# Patient Record
Sex: Female | Born: 1935
Health system: Southern US, Community
[De-identification: ages and names within clinical notes are randomized; demographics above are authoritative.]

## PROBLEM LIST (undated history)

## (undated) DIAGNOSIS — E785 Hyperlipidemia, unspecified: Secondary | ICD-10-CM

## (undated) DIAGNOSIS — I442 Atrioventricular block, complete: Secondary | ICD-10-CM

## (undated) DIAGNOSIS — R600 Localized edema: Secondary | ICD-10-CM

## (undated) DIAGNOSIS — R011 Cardiac murmur, unspecified: Secondary | ICD-10-CM

## (undated) DIAGNOSIS — E039 Hypothyroidism, unspecified: Secondary | ICD-10-CM

## (undated) DIAGNOSIS — I509 Heart failure, unspecified: Secondary | ICD-10-CM

## (undated) DIAGNOSIS — I10 Essential (primary) hypertension: Secondary | ICD-10-CM

## (undated) DIAGNOSIS — N318 Other neuromuscular dysfunction of bladder: Secondary | ICD-10-CM

## (undated) DIAGNOSIS — F419 Anxiety disorder, unspecified: Secondary | ICD-10-CM

## (undated) DIAGNOSIS — R55 Syncope and collapse: Secondary | ICD-10-CM

## (undated) DIAGNOSIS — Z950 Presence of cardiac pacemaker: Secondary | ICD-10-CM

## (undated) DIAGNOSIS — I495 Sick sinus syndrome: Secondary | ICD-10-CM

## (undated) DIAGNOSIS — M810 Age-related osteoporosis without current pathological fracture: Secondary | ICD-10-CM

## (undated) HISTORY — DX: Anxiety disorder, unspecified: F41.9

## (undated) HISTORY — DX: Hyperlipidemia, unspecified: E78.5

## (undated) HISTORY — DX: Syncope and collapse: R55

## (undated) HISTORY — DX: Atrioventricular block, complete: I44.2

## (undated) HISTORY — DX: Other neuromuscular dysfunction of bladder: N31.8

## (undated) HISTORY — DX: Age-related osteoporosis without current pathological fracture: M81.0

## (undated) HISTORY — DX: Presence of cardiac pacemaker: Z95.0

## (undated) HISTORY — DX: Sick sinus syndrome: I49.5

## (undated) HISTORY — PX: PACEMAKER PLACEMENT: SHX43

## (undated) HISTORY — DX: Essential (primary) hypertension: I10

## (undated) HISTORY — DX: Cardiac murmur, unspecified: R01.1

## (undated) HISTORY — DX: Localized edema: R60.0

## (undated) HISTORY — DX: Heart failure, unspecified: I50.9

## (undated) HISTORY — DX: Hypothyroidism, unspecified: E03.9

---

## 1986-11-25 HISTORY — PX: CARDIAC CATHETERIZATION: SHX172

## 1998-05-17 ENCOUNTER — Inpatient Hospital Stay (HOSPITAL_COMMUNITY): Admission: EM | Admit: 1998-05-17 | Discharge: 1998-05-20 | Payer: Self-pay | Admitting: Emergency Medicine

## 1998-05-18 ENCOUNTER — Encounter: Payer: Self-pay | Admitting: Cardiovascular Disease

## 1998-05-18 DIAGNOSIS — I442 Atrioventricular block, complete: Secondary | ICD-10-CM

## 1998-05-18 DIAGNOSIS — Z950 Presence of cardiac pacemaker: Secondary | ICD-10-CM

## 1998-05-18 HISTORY — DX: Presence of cardiac pacemaker: Z95.0

## 1998-05-18 HISTORY — PX: PACEMAKER PLACEMENT: SHX43

## 1998-05-18 HISTORY — DX: Atrioventricular block, complete: I44.2

## 1998-05-19 ENCOUNTER — Encounter: Payer: Self-pay | Admitting: Cardiovascular Disease

## 1999-01-10 ENCOUNTER — Encounter: Payer: Self-pay | Admitting: Surgery

## 1999-01-10 ENCOUNTER — Encounter: Admission: RE | Admit: 1999-01-10 | Discharge: 1999-01-10 | Payer: Self-pay | Admitting: Surgery

## 2000-03-06 ENCOUNTER — Encounter: Payer: Self-pay | Admitting: Surgery

## 2000-03-06 ENCOUNTER — Encounter: Admission: RE | Admit: 2000-03-06 | Discharge: 2000-03-06 | Payer: Self-pay | Admitting: Surgery

## 2003-05-13 ENCOUNTER — Observation Stay (HOSPITAL_COMMUNITY): Admission: EM | Admit: 2003-05-13 | Discharge: 2003-05-13 | Payer: Self-pay | Admitting: Emergency Medicine

## 2005-02-14 ENCOUNTER — Encounter: Admission: RE | Admit: 2005-02-14 | Discharge: 2005-02-14 | Payer: Self-pay | Admitting: Internal Medicine

## 2005-03-06 ENCOUNTER — Encounter: Admission: RE | Admit: 2005-03-06 | Discharge: 2005-03-06 | Payer: Self-pay | Admitting: Internal Medicine

## 2006-02-13 ENCOUNTER — Ambulatory Visit (HOSPITAL_COMMUNITY): Admission: RE | Admit: 2006-02-13 | Discharge: 2006-02-14 | Payer: Self-pay | Admitting: *Deleted

## 2006-02-13 HISTORY — PX: PACEMAKER PLACEMENT: SHX43

## 2007-11-28 IMAGING — CR DG CHEST 1V PORT
1 series · 1 of 1 positions shown · non-contrast
Comparison: none

CLINICAL DATA: 85-year-old end-of-life battery pacemaker placement. Shortness of breath.
 PORTABLE CHEST - 1 VIEW:

[view not recorded]
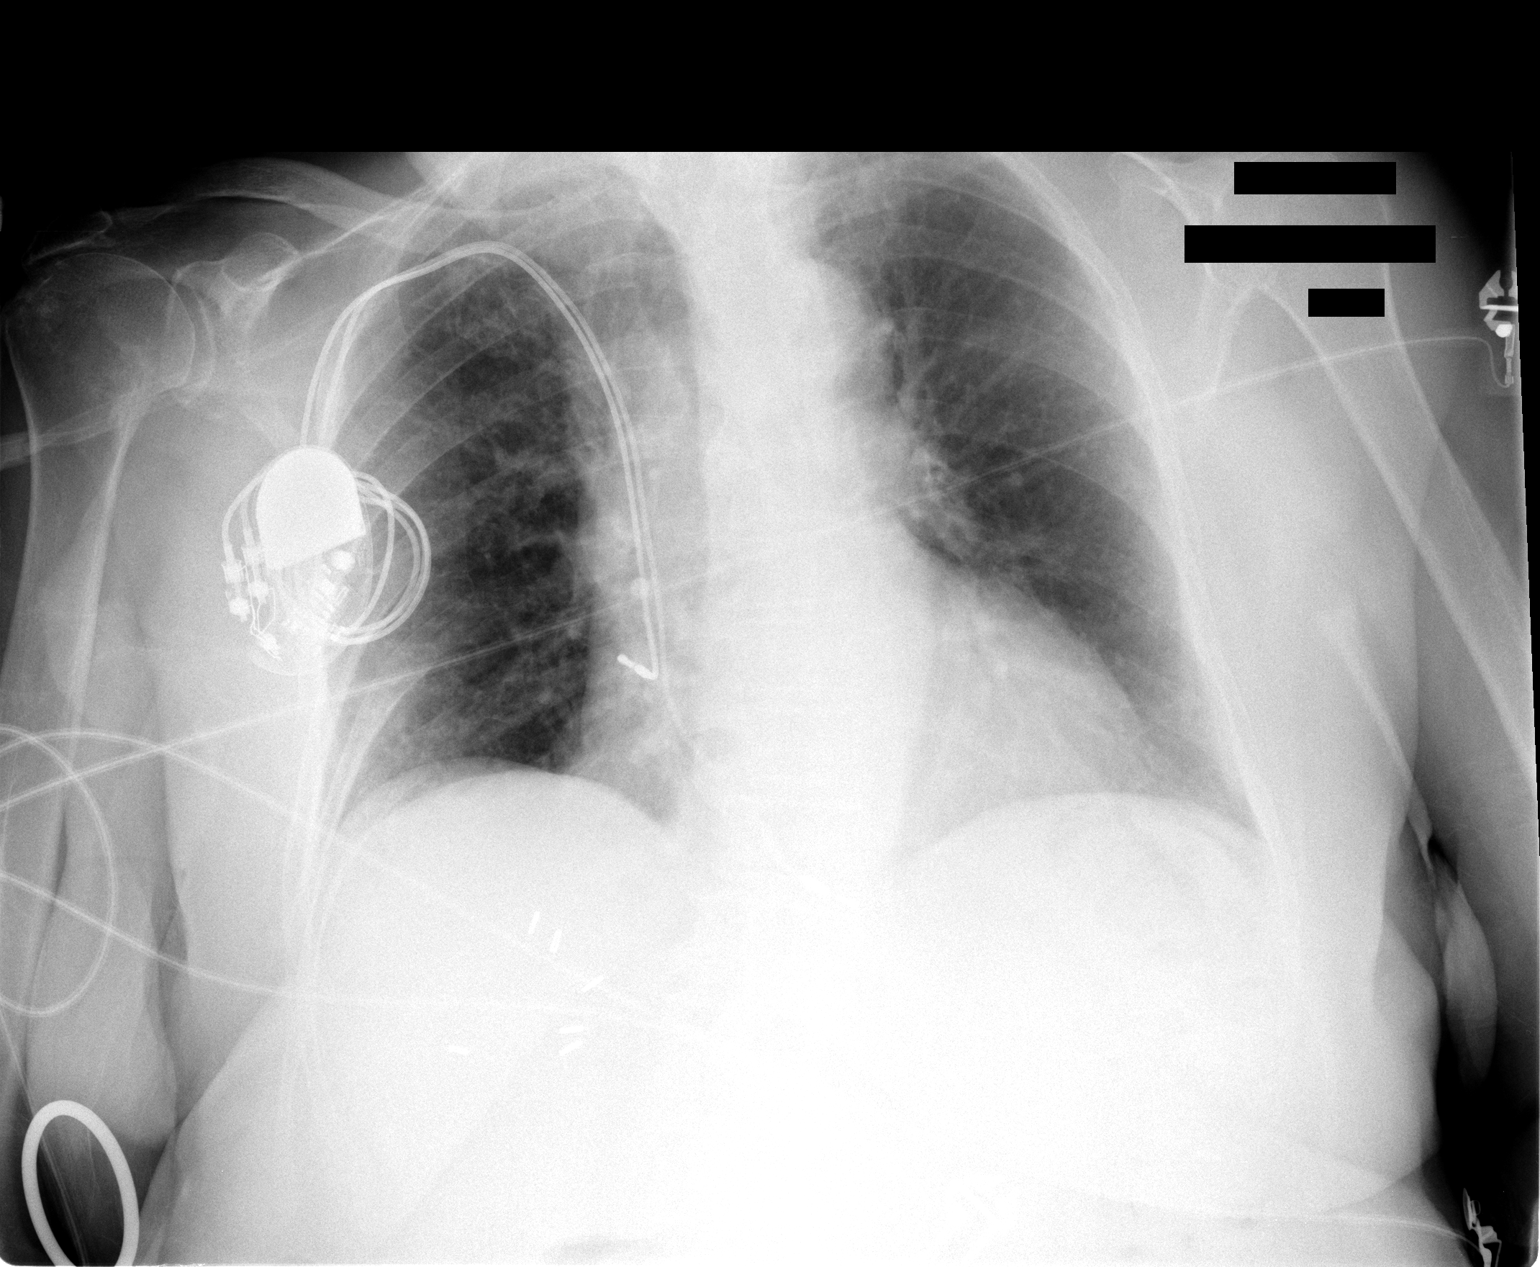

[1 of 1 positions shown; findings below may reference images not displayed]

FINDINGS: Pacer wires are in good position and unchanged.  Chronic lung changes but no acute pulmonary findings.  No pneumothorax is seen.
IMPRESSION: 1.  Pacer wires in good position without complicating features. 
 2.  Chronic lung changes, but no acute pulmonary findings.

## 2007-11-28 IMAGING — CR DG CHEST 2V
2 series · 2 of 2 positions shown · non-contrast
Comparison: 05/13/03.

CLINICAL DATA: Pacemaker reinsertion.  
 CHEST ? 2 VIEW ? 02/13/06 AT [DATE] A.M.:

[view not recorded (1 of 2)]
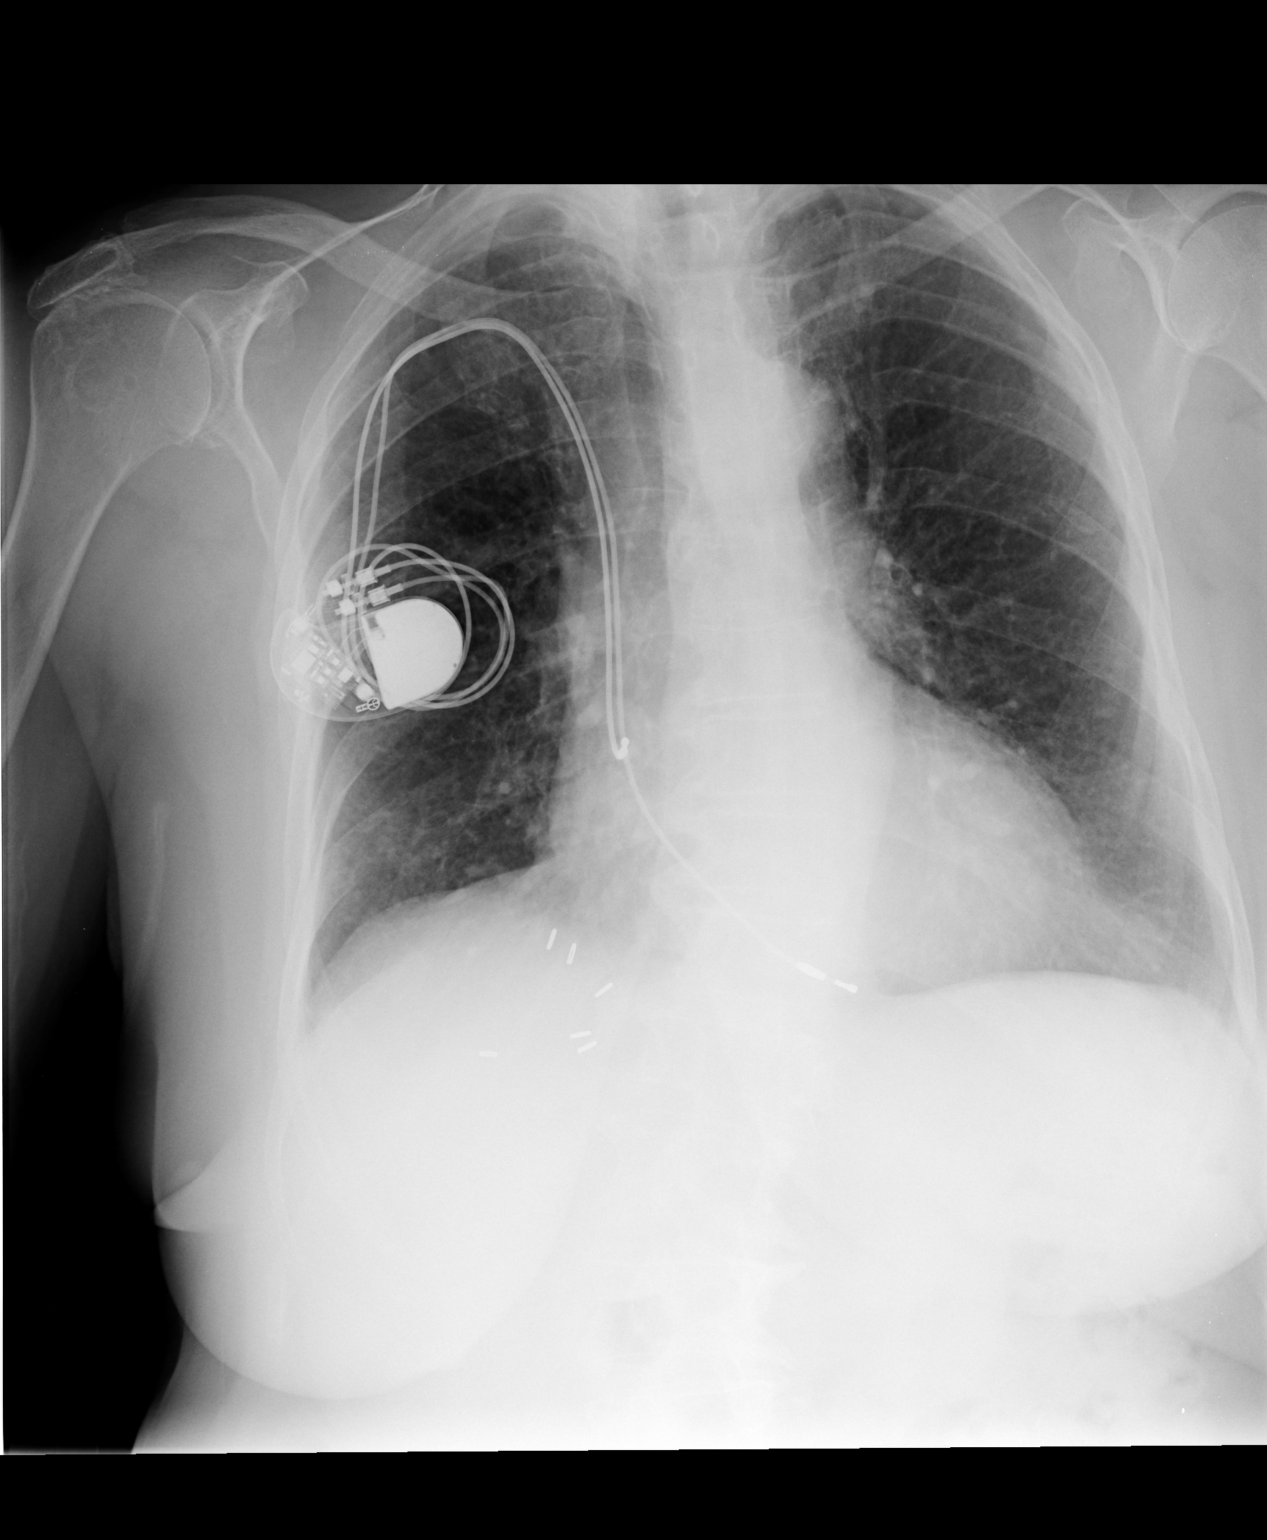

[view not recorded (2 of 2)]
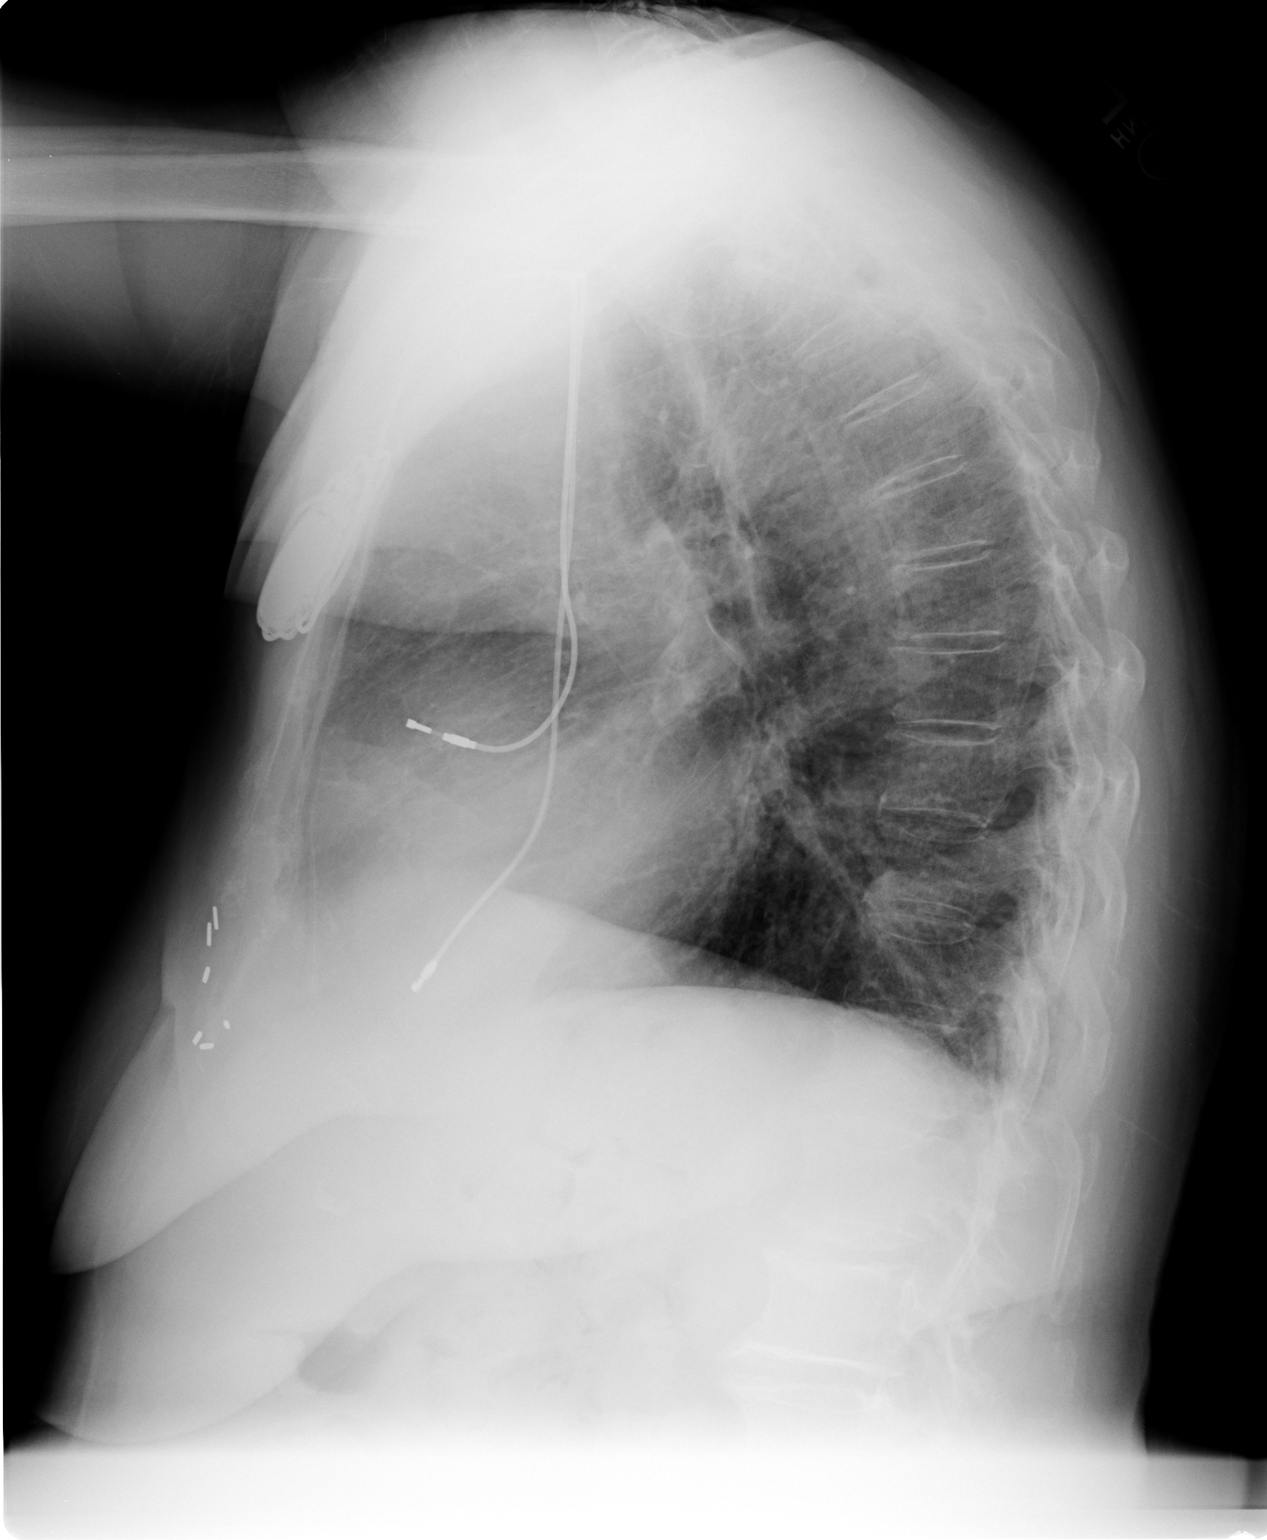

[2 of 2 positions shown; findings below may reference images not displayed]

FINDINGS: A permanent sequential pacemaker is in place entering from the right with the leads in the region of the right atrium and right ventricle.  The heart is slightly prominent in size.  No infiltrate or congestive heart failure.  Chronic increased lung markings.
IMPRESSION: 1.  Permanent sequential pacemaker is placed with mild cardiomegaly.
 2.  No infiltrate or congestive heart failure.

## 2007-11-29 IMAGING — CR DG CHEST 2V
2 series · 2 of 2 positions shown · non-contrast
Comparison: 02/13/06.

CLINICAL DATA: Preop for pacer. 
 CHEST ? 2 VIEW:

[w chest pa]
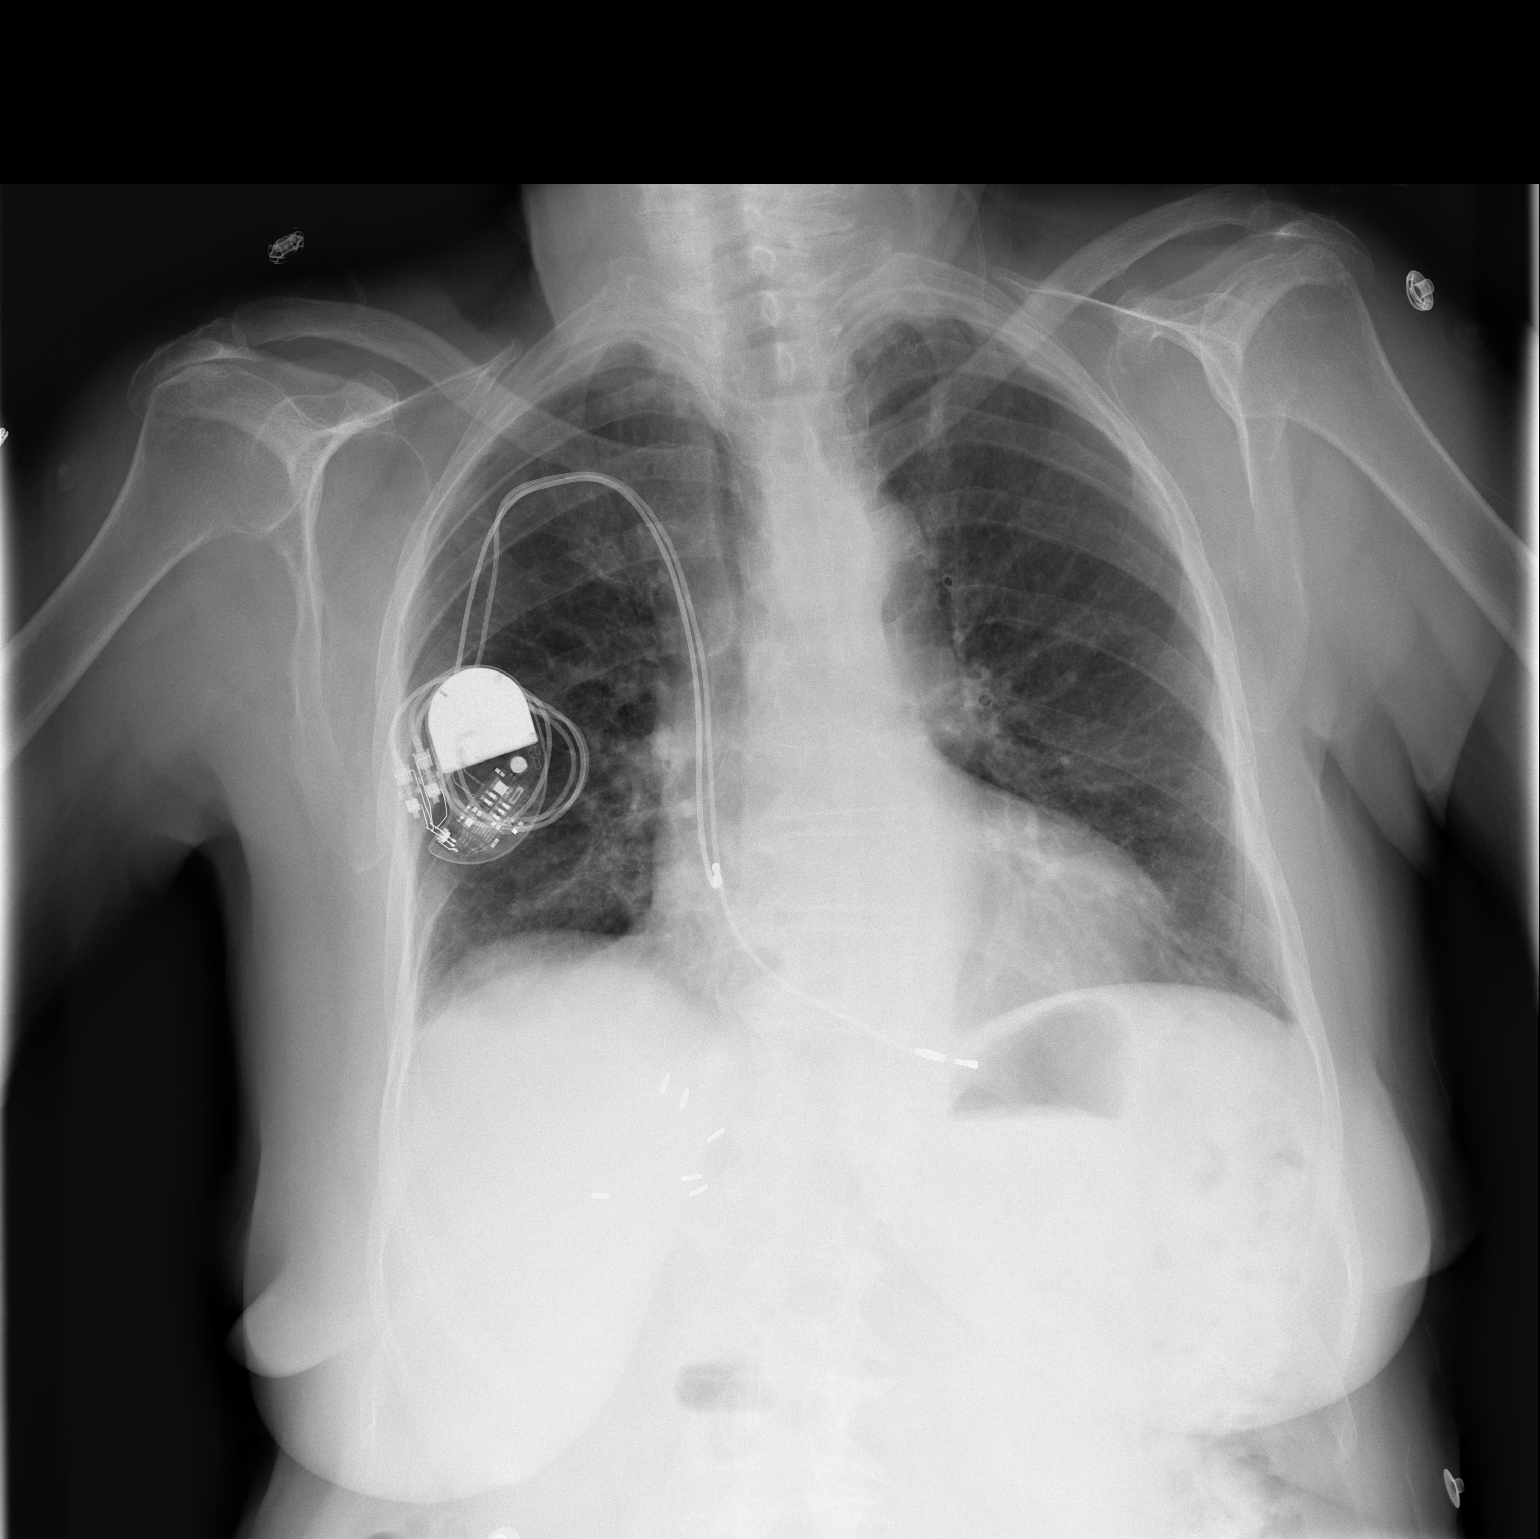

[w chest lat]
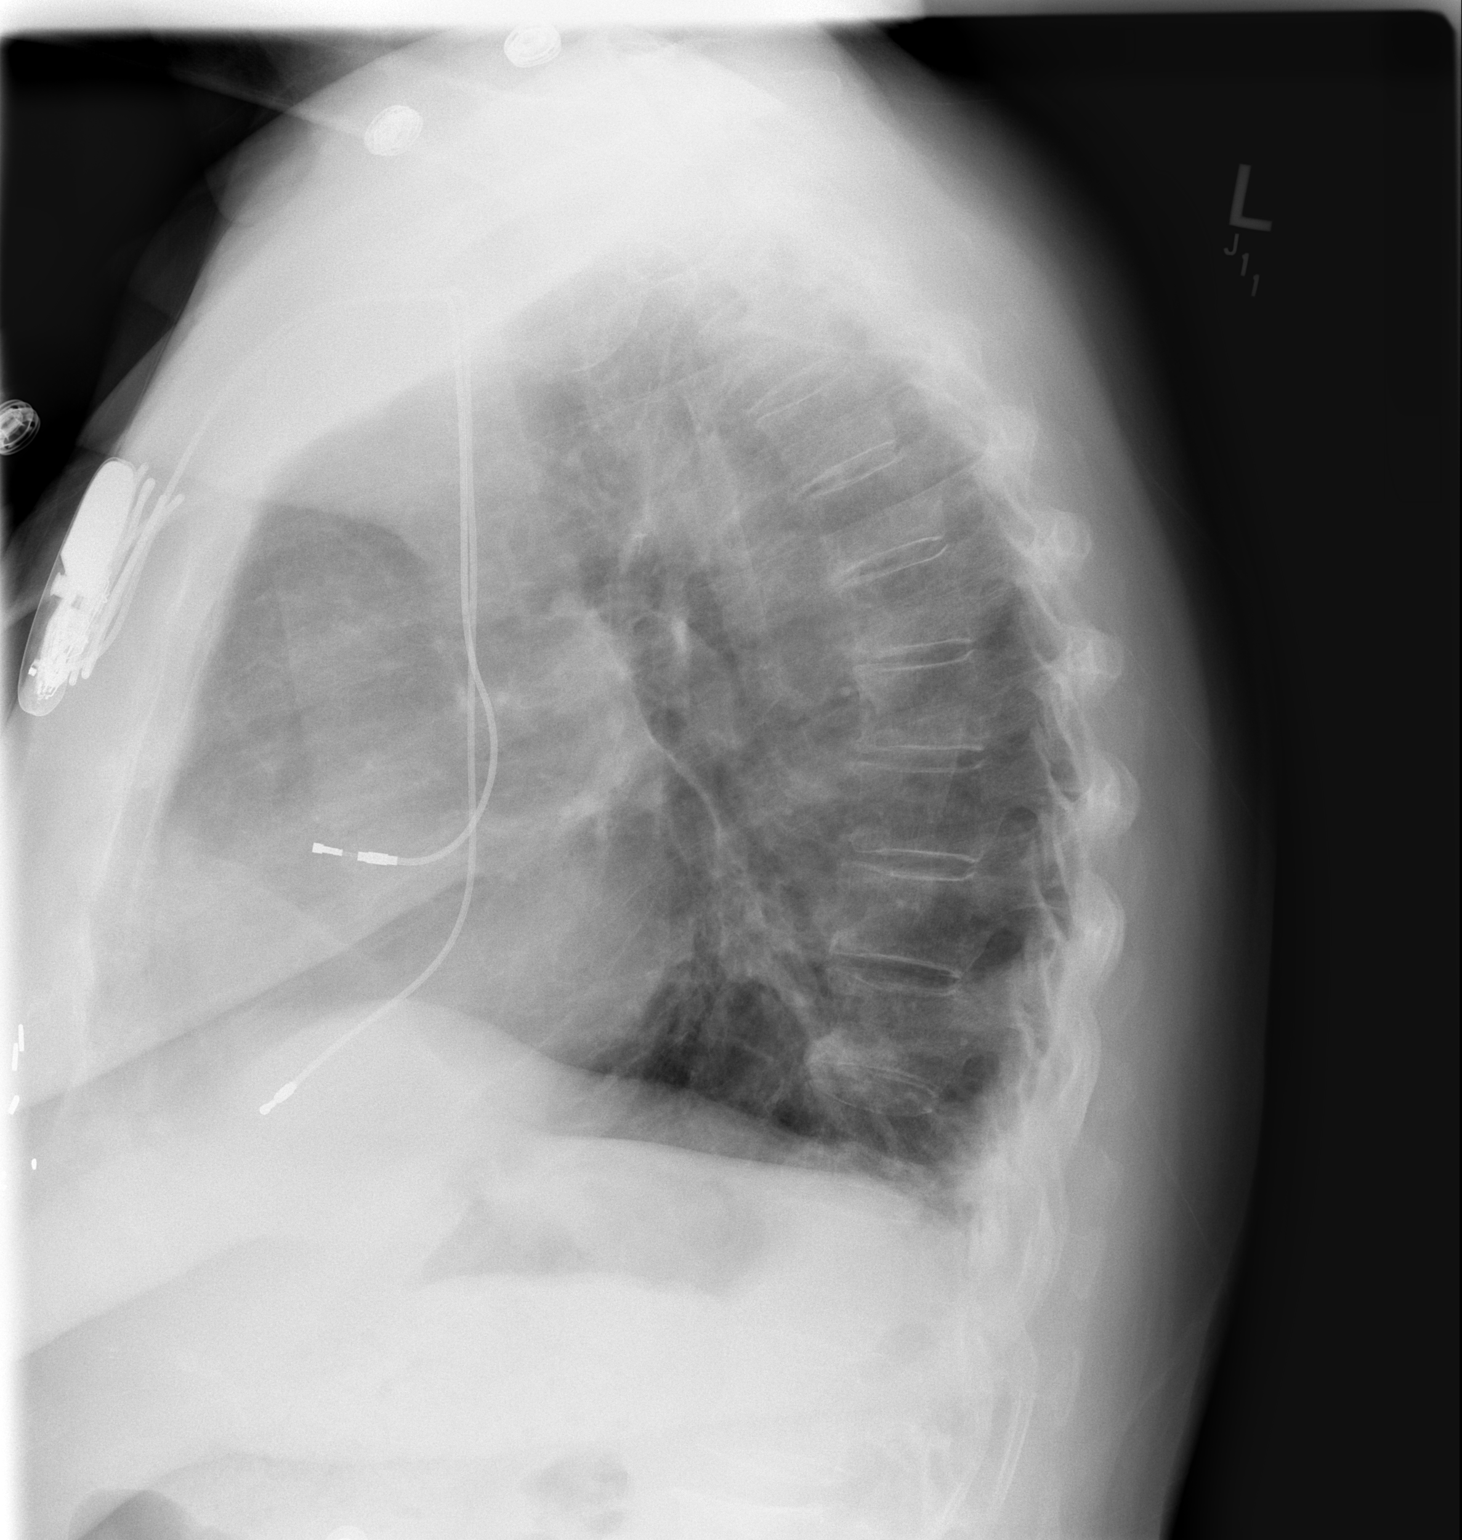

[2 of 2 positions shown; findings below may reference images not displayed]

FINDINGS: The lungs are clear and slightly hyperaerated.  Cardiomegaly is stable.  Permanent pacer remains.
IMPRESSION: No change in cardiomegaly with pacer.  No active lung disease.

## 2010-04-09 ENCOUNTER — Encounter: Payer: Self-pay | Admitting: Internal Medicine

## 2012-04-30 ENCOUNTER — Encounter: Payer: Self-pay | Admitting: *Deleted

## 2012-08-05 ENCOUNTER — Other Ambulatory Visit: Payer: Self-pay | Admitting: Cardiovascular Disease

## 2012-08-05 LAB — COMPREHENSIVE METABOLIC PANEL
Albumin: 4.2 g/dL (ref 3.5–5.2)
CO2: 29 mEq/L (ref 19–32)
Calcium: 9.7 mg/dL (ref 8.4–10.5)
Glucose, Bld: 90 mg/dL (ref 70–99)
Potassium: 4.4 mEq/L (ref 3.5–5.3)
Sodium: 141 mEq/L (ref 135–145)
Total Bilirubin: 0.9 mg/dL (ref 0.3–1.2)
Total Protein: 7 g/dL (ref 6.0–8.3)

## 2012-08-05 LAB — CBC WITH DIFFERENTIAL/PLATELET
Eosinophils Absolute: 0.3 10*3/uL (ref 0.0–0.7)
Hemoglobin: 13.1 g/dL (ref 12.0–15.0)
Lymphs Abs: 2.8 10*3/uL (ref 0.7–4.0)
MCH: 29.1 pg (ref 26.0–34.0)
Monocytes Relative: 6 % (ref 3–12)
Neutrophils Relative %: 44 % (ref 43–77)
RBC: 4.5 MIL/uL (ref 3.87–5.11)

## 2012-08-05 LAB — LIPID PANEL
Cholesterol: 157 mg/dL (ref 0–200)
Triglycerides: 155 mg/dL — ABNORMAL HIGH (ref <150)

## 2012-08-07 ENCOUNTER — Encounter: Payer: Self-pay | Admitting: Cardiovascular Disease

## 2012-08-21 ENCOUNTER — Ambulatory Visit (HOSPITAL_COMMUNITY): Payer: Self-pay

## 2012-12-30 ENCOUNTER — Telehealth: Payer: Self-pay | Admitting: Cardiovascular Disease

## 2013-01-01 MED ORDER — LOSARTAN POTASSIUM 50 MG PO TABS: 50.0000 mg | ORAL_TABLET | Freq: Every day | ORAL | Status: DC

## 2013-01-01 MED ORDER — SIMVASTATIN 40 MG PO TABS: 40.0000 mg | ORAL_TABLET | Freq: Every evening | ORAL | Status: DC

## 2013-01-01 MED ORDER — LEVOTHYROXINE SODIUM 50 MCG PO TABS: 50.0000 ug | ORAL_TABLET | Freq: Every day | ORAL | Status: DC

## 2013-01-01 MED ORDER — VERAPAMIL HCL 240 MG (CO) PO TB24: 240.0000 mg | ORAL_TABLET | Freq: Every day | ORAL | Status: DC

## 2013-01-01 NOTE — Telephone Encounter (Signed)
Still have not gotten her medicine.The pharmacist want you to call them-[facility]-[phone].Please call today.

## 2013-01-01 NOTE — Telephone Encounter (Signed)
Returned call to [facility].  Informed pt need refills on levothyroxine, verapamil, losartan and simvastatin.  Informed will be reviewed and sent electronically w/ new cardiologist as Dr. Alanda Amass has retired.  Verbalized understanding.  Refill(s) sent to pharmacy.  Levothyroxine sent for one 90-day supply w/o refills and instructions to see PCP for future refills.

## 2013-01-22 ENCOUNTER — Encounter: Payer: Self-pay | Admitting: Cardiovascular Disease

## 2013-01-22 ENCOUNTER — Ambulatory Visit (INDEPENDENT_AMBULATORY_CARE_PROVIDER_SITE_OTHER): Payer: Medicare HMO | Admitting: Cardiovascular Disease

## 2013-01-22 VITALS — BP 138/72 | HR 64 | Resp 16 | Ht 62.0 in | Wt 145.8 lb

## 2013-01-22 DIAGNOSIS — I10 Essential (primary) hypertension: Secondary | ICD-10-CM | POA: Insufficient documentation

## 2013-01-22 DIAGNOSIS — IMO0001 Reserved for inherently not codable concepts without codable children: Secondary | ICD-10-CM | POA: Insufficient documentation

## 2013-01-22 DIAGNOSIS — Z23 Encounter for immunization: Secondary | ICD-10-CM

## 2013-01-22 DIAGNOSIS — Z950 Presence of cardiac pacemaker: Secondary | ICD-10-CM

## 2013-01-22 DIAGNOSIS — Z0389 Encounter for observation for other suspected diseases and conditions ruled out: Secondary | ICD-10-CM

## 2013-01-22 DIAGNOSIS — I442 Atrioventricular block, complete: Secondary | ICD-10-CM | POA: Insufficient documentation

## 2013-01-22 DIAGNOSIS — E785 Hyperlipidemia, unspecified: Secondary | ICD-10-CM

## 2013-01-22 HISTORY — DX: Atrioventricular block, complete: I44.2

## 2013-01-22 LAB — PACEMAKER DEVICE OBSERVATION
ATRIAL PACING PM: 11
DEVICE MODEL PM: 1810064

## 2013-01-22 MED ORDER — PRAVASTATIN SODIUM 40 MG PO TABS: 40.0000 mg | ORAL_TABLET | Freq: Every evening | ORAL | Status: DC

## 2013-01-22 NOTE — Progress Notes (Signed)
Your physician recommends that you schedule a follow-up appointment in: 6 months with Dr.Croitoru    

## 2013-01-22 NOTE — Progress Notes (Signed)
Patient ID: Abigail Melendez, female   DOB: 02-03-1921, 79 y.o.   MRN: [id number]      Reason for office visit Complete heart block status post pacemaker; hypertension; hyperlipidemia  Abigail Melendez is a former patient of Dr. Alanda Amass and is here to establish new cardiology followup. Chest long-standing history of conduction system disease and now has complete heart block and is pacemaker dependent. Her pacemaker was initially implanted in 1991 and we are still using the same leads. Chest subsequent generator changes in 2000 and 2007. Her device is a Engineer, water. Jude victory XL DR device. Comprehensive in office check today shows normal device function. She does not have any underlying escape rhythm and is 100% ventricular paced  She has well treated systemic hypertension hyperlipidemia. She has been on combination of verapamil and simvastatin since January of 2012, so far without any overt evidence of complications secondary to drug interaction. She does complain of "knots" which pop up in her calf muscles. I think she is describing muscle cramps. She does take furosemide without a potassium supplement. She doesn't that she had lab work performed with Dr. Wylene Simmer last month, with normal results.  She has no complaints other than the muscle cramps.   Allergies  Allergen Reactions  . Codeine     Current Outpatient Prescriptions  Medication Sig Dispense Refill  . aspirin 81 MG tablet Take 81 mg by mouth daily.      . furosemide (LASIX) 40 MG tablet Take 40 mg by mouth as needed.       Marland Kitchen levothyroxine (SYNTHROID, LEVOTHROID) 50 MCG tablet Take 1 tablet (50 mcg total) by mouth daily. See PCP for future refills.  90 tablet  0  . losartan (COZAAR) 50 MG tablet Take 1 tablet (50 mg total) by mouth daily.  90 tablet  1  . Multiple Vitamin (MULTIVITAMIN) tablet Take 1 tablet by mouth daily.      . simvastatin (ZOCOR) 40 MG tablet Take 1 tablet (40 mg total) by mouth every evening.  90 tablet  1  . verapamil  (COVERA HS) 240 MG (CO) 24 hr tablet Take 1 tablet (240 mg total) by mouth at bedtime.  90 tablet  1   No current facility-administered medications for this visit.    Past Medical History  Diagnosis Date  . CHF (congestive heart failure)   . Hypertension   . Hyperlipidemia   . Hypothyroidism   . Anxiety   . Complete heart block May 18, 1998    pacemaker implanted  . Pacemaker february 29,2000    St.Jude affinityDR modle#5330 serial#[id number]  . Hypertonicity of bladder   . Osteoporosis   . CHF (congestive heart failure) 08-17-2009 adult echocardiogram    EF 55% mild abnormalities no change in therapy  . Murmur 09-05-2011 adult echocardiogram    EF 55% mild abnormalities; no change in therapy   . Sick sinus syndrome 06-12-2005 persantine cardiolite    normal cardiolite myocardial perfusion study    . Syncope, near 05-13-2003 corotid doppler    mild heterogeneous plaque noted at origins of ica andeca no evidence of significant ica stenosis . vertebral artery flow is antegrade  . Edema leg 10-12-1997 lower extremity venous study    pain and edema x 1 week . no dvt,large non-vascular mass located at the level of the lt. medial knee and extends to the proximal calf  . CHB (complete heart block) 01/22/2013    Pacemaker dependent     Past Surgical History  Procedure  Laterality Date  . Pacemaker placement  inital implant 12-10-1989    symtomatic heart block ; non rate responsive pacemaker paragon II generator  . Pacemaker placement  05-18-1998    new DDDR bipolar autocapture,mode switching pacesetter affinity DR pulse generator model number 5330r; serial # [id number]  . Pacemaker placement  02-13-2006    st. Tomi Likens DR, model [phone], serial [id number]  . Cardiac catheterization  11-25-1986    lt. heart cath for exertional cp and syncope;coronaries normal except very minor 20% lesion in lad possible coronary spasm    No family history on file.  History   Social History  . Marital  Status: Widowed    Spouse Name: N/A    Number of Children: N/A  . Years of Education: N/A   Occupational History  . Not on file.   Social History Main Topics  . Smoking status: Former Games developer  . Smokeless tobacco: Never Used  . Alcohol Use: Not on file  . Drug Use: Not on file  . Sexual Activity: Not on file   Other Topics Concern  . Not on file   Social History Narrative  . No narrative on file    Review of systems: The patient specifically denies any chest pain at rest or with exertion, dyspnea at rest or with exertion, orthopnea, paroxysmal nocturnal dyspnea, syncope, palpitations, focal neurological deficits, intermittent claudication, lower extremity edema, unexplained weight gain, cough, hemoptysis or wheezing.  The patient also denies abdominal pain, nausea, vomiting, dysphagia, diarrhea, constipation, polyuria, polydipsia, dysuria, hematuria, frequency, urgency, abnormal bleeding or bruising, fever, chills, unexpected weight changes, mood swings, change in skin or hair texture, change in voice quality, auditory or visual problems, allergic reactions or rashes, new musculoskeletal complaints other than usual "aches and pains".   PHYSICAL EXAM BP 138/72  Pulse 64  Resp 16  Ht 5\' 2"  (1.575 m)  Wt 145 lb 12.8 oz (66.134 kg)  BMI 26.66 kg/m2  General: Alert, oriented x3, no distress Head: no evidence of trauma, PERRL, EOMI, no exophtalmos or lid lag, no myxedema, no xanthelasma; normal ears, nose and oropharynx Neck: normal jugular venous pulsations and no hepatojugular reflux; brisk carotid pulses without delay and no carotid bruits Chest: clear to auscultation, no signs of consolidation by percussion or palpation, normal fremitus, symmetrical and full respiratory excursions; 3 scars of pacemaker generator procedures in the right subclavian area, the site appears healthy Cardiovascular: normal position and quality of the apical impulse, regular rhythm, normal first and  third occipital he split second heart sounds, no  rubs or gallops. She has a very faint early peaking systolic ejection murmur in the aortic focus radiating towards the carotids but not really reaching them. I do not appreciate any diastolic murmur of aortic insufficiency Abdomen: no tenderness or distention, no masses by palpation, no abnormal pulsatility or arterial bruits, normal bowel sounds, no hepatosplenomegaly Extremities: no clubbing, cyanosis or edema; 2+ radial, ulnar and brachial pulses bilaterally; 2+ right femoral, posterior tibial and dorsalis pedis pulses; 2+ left femoral, posterior tibial and dorsalis pedis pulses; no subclavian or femoral bruits Neurological: grossly nonfocal   EKG: Atrial paced ventricular sensed  Lipid Panel     Component Value Date/Time   CHOL 157 08/05/2012 1058   TRIG 155* 08/05/2012 1058   HDL 48 08/05/2012 1058   CHOLHDL 3.3 08/05/2012 1058   VLDL 31 08/05/2012 1058   LDLCALC 78 08/05/2012 1058    BMET    Component Value Date/Time   NA 141  08/05/2012 1058   K 4.4 08/05/2012 1058   CL 103 08/05/2012 1058   CO2 29 08/05/2012 1058   GLUCOSE 90 08/05/2012 1058   BUN 20 08/05/2012 1058   CREATININE 0.90 08/05/2012 1058   CALCIUM 9.7 08/05/2012 1058     ASSESSMENT AND PLAN  She has no cardiac complaints. Pacemaker settings are all appropriate and no permanent changes were made. She appears to have another 5 years or so of battery life. She is pacemaker dependent and needs to take appropriate precautions to avoid strong magnetic fields. We'll continue checking her device every 6 months.  I am concerned about the combination of verapamil and simvastatin. I have advised that we switch to pravastatin to avoid drug interaction.  At her request we gave her a flu shot today.  Dr. Kandis Cocking notes described hypertrophic cardiomyopathy like physiology with LV outflow tract obstruction, but this is not at all apparent on her physical exam today. She had mild  aortic insufficiency by previous echo. At this time she does not appear to have any hemodynamically important valvular issues.  Meds ordered this encounter  Medications  . Multiple Vitamin (MULTIVITAMIN) tablet    Sig: Take 1 tablet by mouth daily.    Junious Silk, MD, [facility] [phone] office [phone] pager

## 2013-01-22 NOTE — Patient Instructions (Signed)
Your physician has recommended you make the following change in your medication: Stop taking simvastatin; start pravastatin 40 mg in the evening daily Your physician recommends that you schedule a follow-up appointment in: 6 months; we will also check her pacemaker at that time.

## 2013-01-27 ENCOUNTER — Telehealth: Payer: Self-pay | Admitting: *Deleted

## 2013-01-27 NOTE — Telephone Encounter (Signed)
[facility] notified to D/C simvastatin, start Pravastatin 40mg  [facility] sent via fax.

## 2013-01-31 ENCOUNTER — Telehealth: Payer: Self-pay | Admitting: Internal Medicine

## 2013-01-31 NOTE — Telephone Encounter (Signed)
Pt was seen by DR. C 01-22-13/mt

## 2013-07-16 ENCOUNTER — Other Ambulatory Visit: Payer: Self-pay | Admitting: Cardiovascular Disease

## 2013-07-16 NOTE — Telephone Encounter (Signed)
Rx was sent to pharmacy electronically. 

## 2013-07-21 ENCOUNTER — Telehealth: Payer: Self-pay | Admitting: Cardiovascular Disease

## 2013-07-21 MED ORDER — VERAPAMIL HCL 240 MG (CO) PO TB24: 240.0000 mg | ORAL_TABLET | Freq: Every day | ORAL | Status: DC

## 2013-07-21 NOTE — Telephone Encounter (Signed)
Needs her verapamil 240 med called in to [facility]  [phone].  Only has a few pills left.  Please call

## 2013-07-21 NOTE — Telephone Encounter (Signed)
Returned call and pt verified x 2.  Pt informed message received.  Asked how many pills she has left.  Pt stated she has about 5, but has enough to last until it comes in.  Pt advised to keep her appt next week and informed refill will be sent.  Pt verbalized understanding and agreed w/ plan.  Refill(s) sent to pharmacy: [facility].

## 2013-07-22 ENCOUNTER — Encounter: Payer: Medicare HMO | Admitting: Cardiovascular Disease

## 2013-07-28 ENCOUNTER — Ambulatory Visit (INDEPENDENT_AMBULATORY_CARE_PROVIDER_SITE_OTHER): Payer: Medicare HMO | Admitting: Cardiovascular Disease

## 2013-07-28 ENCOUNTER — Encounter: Payer: Self-pay | Admitting: Cardiovascular Disease

## 2013-07-28 VITALS — BP 128/62 | HR 63 | Resp 16 | Ht 63.0 in | Wt 145.9 lb

## 2013-07-28 DIAGNOSIS — Z950 Presence of cardiac pacemaker: Secondary | ICD-10-CM

## 2013-07-28 DIAGNOSIS — I442 Atrioventricular block, complete: Secondary | ICD-10-CM

## 2013-07-28 DIAGNOSIS — E785 Hyperlipidemia, unspecified: Secondary | ICD-10-CM

## 2013-07-28 DIAGNOSIS — I10 Essential (primary) hypertension: Secondary | ICD-10-CM

## 2013-07-28 LAB — MDC_IDC_ENUM_SESS_TYPE_INCLINIC
Battery Voltage: 2.78 V
Brady Statistic RV Percent Paced: 99 %
Date Time Interrogation Session: 20150511135249
Implantable Pulse Generator Model: 5816
Implantable Pulse Generator Serial Number: 1810064
Lead Channel Pacing Threshold Amplitude: 0.75 V
Lead Channel Pacing Threshold Amplitude: 0.875 V
Lead Channel Pacing Threshold Pulse Width: 0.4 ms
Lead Channel Pacing Threshold Pulse Width: 0.5 ms
Lead Channel Sensing Intrinsic Amplitude: 3 mV
Lead Channel Setting Pacing Amplitude: 1.75 V
Lead Channel Setting Pacing Pulse Width: 0.5 ms
MDC IDC MSMT BATTERY IMPEDANCE: 2000 Ohm
MDC IDC MSMT LEADCHNL RA IMPEDANCE VALUE: 380 Ohm
MDC IDC MSMT LEADCHNL RV IMPEDANCE VALUE: 304 Ohm
MDC IDC SET LEADCHNL RV SENSING SENSITIVITY: 4 mV
MDC IDC STAT BRADY RA PERCENT PACED: 7.3 %

## 2013-07-28 LAB — PACEMAKER DEVICE OBSERVATION

## 2013-07-28 MED ORDER — VERAPAMIL HCL 240 MG (CO) PO TB24: 240.0000 mg | ORAL_TABLET | Freq: Every day | ORAL | Status: DC

## 2013-07-28 NOTE — Progress Notes (Signed)
Patient ID: Abigail Melendez, female   DOB: Dec 08, 1920, 80 y.o.   MRN: [id number]      Reason for office visit Complete heart block, pacemaker check, hypertension, hyperlipidemia  Abigail Melendez is now 80 years old and has a long-standing history of conduction system disease and now has complete heart block and is pacemaker dependent. Her pacemaker was initially implanted in 1991 and we are still using the same leads. Chest subsequent generator changes in 2000 and 2007. Her device is a Engineer, water. Jude victory XL DR device. Comprehensive in office check today shows normal device function. She does not have any underlying escape rhythm and is 100% ventricular paced.  She has well treated systemic hypertension hyperlipidemia. Has no complaints today. She is a little depressed since he has had 5 close friends or family members die within the last few months   Allergies  Allergen Reactions  . Codeine     Current Outpatient Prescriptions  Medication Sig Dispense Refill  . aspirin 81 MG tablet Take 81 mg by mouth daily.      . furosemide (LASIX) 40 MG tablet Take 40 mg by mouth as needed.       Marland Kitchen levothyroxine (SYNTHROID, LEVOTHROID) 50 MCG tablet Take 1 tablet (50 mcg total) by mouth daily. See PCP for future refills.  90 tablet  0  . losartan (COZAAR) 50 MG tablet TAKE 1 TABLET DAILY  90 tablet  1  . Multiple Vitamin (MULTIVITAMIN) tablet Take 1 tablet by mouth daily.      . pantoprazole (PROTONIX) 40 MG tablet Take 40 mg by mouth daily.      . pravastatin (PRAVACHOL) 40 MG tablet Take 1 tablet (40 mg total) by mouth every evening.  90 tablet  3  . verapamil (COVERA HS) 240 MG (CO) 24 hr tablet Take 1 tablet (240 mg total) by mouth at bedtime.  30 tablet  0   No current facility-administered medications for this visit.    Past Medical History  Diagnosis Date  . CHF (congestive heart failure)   . Hypertension   . Hyperlipidemia   . Hypothyroidism   . Anxiety   . Complete heart block May 18, 1998    pacemaker implanted  . Pacemaker february 29,2000    St.Jude affinityDR modle#5330 serial#[id number]  . Hypertonicity of bladder   . Osteoporosis   . CHF (congestive heart failure) 08-17-2009 adult echocardiogram    EF 55% mild abnormalities no change in therapy  . Murmur 09-05-2011 adult echocardiogram    EF 55% mild abnormalities; no change in therapy   . Sick sinus syndrome 06-12-2005 persantine cardiolite    normal cardiolite myocardial perfusion study    . Syncope, near 05-13-2003 corotid doppler    mild heterogeneous plaque noted at origins of ica andeca no evidence of significant ica stenosis . vertebral artery flow is antegrade  . Edema leg 10-12-1997 lower extremity venous study    pain and edema x 1 week . no dvt,large non-vascular mass located at the level of the lt. medial knee and extends to the proximal calf  . CHB (complete heart block) 01/22/2013    Pacemaker dependent     Past Surgical History  Procedure Laterality Date  . Pacemaker placement  inital implant 12-10-1989    symtomatic heart block ; non rate responsive pacemaker paragon II generator  . Pacemaker placement  05-18-1998    new DDDR bipolar autocapture,mode switching pacesetter affinity DR pulse generator model number 5330r; serial # [id number]  .  Pacemaker placement  02-13-2006    st. Tomi Likensjude Victory XL DR, model [id number], serial [id number]  . Cardiac catheterization  11-25-1986    lt. heart cath for exertional cp and syncope;coronaries normal except very minor 20% lesion in lad possible coronary spasm    No family history on file.  History   Social History  . Marital Status: Widowed    Spouse Name: N/A    Number of Children: N/A  . Years of Education: N/A   Occupational History  . Not on file.   Social History Main Topics  . Smoking status: Former Games developermoker  . Smokeless tobacco: Never Used  . Alcohol Use: Not on file  . Drug Use: Not on file  . Sexual Activity: Not on file   Other Topics Concern  . Not on  file   Social History Narrative  . No narrative on file    Review of systems: The patient specifically denies any chest pain at rest or with exertion, dyspnea at rest or with exertion, orthopnea, paroxysmal nocturnal dyspnea, syncope, palpitations, focal neurological deficits, intermittent claudication, lower extremity edema, unexplained weight gain, cough, hemoptysis or wheezing.  The patient also denies abdominal pain, nausea, vomiting, dysphagia, diarrhea, constipation, polyuria, polydipsia, dysuria, hematuria, frequency, urgency, abnormal bleeding or bruising, fever, chills, unexpected weight changes, mood swings, change in skin or hair texture, change in voice quality, auditory or visual problems, allergic reactions or rashes, new musculoskeletal complaints other than usual "aches and pains".      PHYSICAL EXAM BP 128/62  Pulse 63  Resp 16  Ht 5\' 3"  (1.6 m)  Wt 145 lb 14.4 oz (66.18 kg)  BMI 25.85 kg/m2 General: Alert, oriented x3, no distress  Head: no evidence of trauma, PERRL, EOMI, no exophtalmos or lid lag, no myxedema, no xanthelasma; normal ears, nose and oropharynx  Neck: normal jugular venous pulsations and no hepatojugular reflux; brisk carotid pulses without delay and no carotid bruits  Chest: clear to auscultation, no signs of consolidation by percussion or palpation, normal fremitus, symmetrical and full respiratory excursions; 3 scars of pacemaker generator procedures in the right subclavian area, the site appears healthy  Cardiovascular: normal position and quality of the apical impulse, regular rhythm, normal first and third occipital he split second heart sounds, no rubs or gallops. She has a very faint early peaking systolic ejection murmur in the aortic focus radiating towards the carotids but not really reaching them. I do not appreciate any diastolic murmur of aortic insufficiency  Abdomen: no tenderness or distention, no masses by palpation, no abnormal pulsatility  or arterial bruits, normal bowel sounds, no hepatosplenomegaly  Extremities: no clubbing, cyanosis or edema; 2+ radial, ulnar and brachial pulses bilaterally; 2+ right femoral, posterior tibial and dorsalis pedis pulses; 2+ left femoral, posterior tibial and dorsalis pedis pulses; no subclavian or femoral bruits  Neurological: grossly nonfocal  EKG: Atrial paced ventricular sensed   Lipid Panel     Component Value Date/Time   CHOL 157 08/05/2012 1058   TRIG 155* 08/05/2012 1058   HDL 48 08/05/2012 1058   CHOLHDL 3.3 08/05/2012 1058   VLDL 31 08/05/2012 1058   LDLCALC 78 08/05/2012 1058    BMET    Component Value Date/Time   NA 141 08/05/2012 1058   K 4.4 08/05/2012 1058   CL 103 08/05/2012 1058   CO2 29 08/05/2012 1058   GLUCOSE 90 08/05/2012 1058   BUN 20 08/05/2012 1058   CREATININE 0.90 08/05/2012 1058   CALCIUM 9.7 08/05/2012  1058     ASSESSMENT AND PLAN  Abigail Melendez is pacemaker dependent due to complete heart block, but has a normally functioning dual-chamber permanent pacemaker with an expected longevity of 3-5 years. Unfortunately her device is not amenable to remote monitoring. She'll return to the office every 6 months for full pacemaker checks.  Meds ordered this encounter  Medications  . pantoprazole (PROTONIX) 40 MG tablet    Sig: Take 40 mg by mouth daily.  . verapamil (COVERA HS) 240 MG (CO) 24 hr tablet    Sig: Take 1 tablet (240 mg total) by mouth at bedtime.    Dispense:  30 tablet    Refill:  0    Abigail Melendez  Thurmon FairMihai Thaila Bottoms, MD, [facility] [phone] office [phone] pager

## 2013-07-28 NOTE — Patient Instructions (Signed)
Your physician recommends that you schedule a follow-up appointment in: 6 months with Dr.Croitoru + pacemaker check   

## 2013-07-29 ENCOUNTER — Encounter: Payer: Self-pay | Admitting: Cardiovascular Disease

## 2013-08-28 ENCOUNTER — Telehealth: Payer: Self-pay | Admitting: Cardiovascular Disease

## 2013-08-28 MED ORDER — VERAPAMIL HCL 240 MG (CO) PO TB24: 240.0000 mg | ORAL_TABLET | Freq: Every day | ORAL | Status: DC

## 2013-08-28 NOTE — Telephone Encounter (Signed)
RX refill sent to local pharmacy

## 2013-08-28 NOTE — Telephone Encounter (Signed)
Pt's mail order company is still out of her Verapamil 240 mg.She wants you to call her a new prescription to [phone]. Please call this in for her today.

## 2013-10-20 ENCOUNTER — Telehealth: Payer: Self-pay | Admitting: Cardiovascular Disease

## 2013-10-20 MED ORDER — VERAPAMIL HCL 240 MG (CO) PO TB24: 240.0000 mg | ORAL_TABLET | Freq: Every day | ORAL | Status: DC

## 2013-10-20 NOTE — Telephone Encounter (Signed)
Rx was sent to pharmacy electronically. 

## 2013-10-20 NOTE — Telephone Encounter (Signed)
Mrs.Mckissack is calling about her prescription refill. The pharmacy told her that she needed to call and have the doctor send in a new prescription for Verapamil 240mg  . It  Need to go to [facility] at [phone].Marland Kitchen.Marland Kitchen.please call   Thanks

## 2013-10-29 ENCOUNTER — Telehealth: Payer: Self-pay | Admitting: Cardiovascular Disease

## 2013-10-29 ENCOUNTER — Other Ambulatory Visit: Payer: Self-pay | Admitting: *Deleted

## 2013-10-29 MED ORDER — VERAPAMIL HCL 240 MG (CO) PO TB24: 240.0000 mg | ORAL_TABLET | Freq: Every day | ORAL | Status: DC

## 2013-10-29 NOTE — Telephone Encounter (Signed)
Clarified patient's verapramil per chart. [facility]

## 2013-10-29 NOTE — Telephone Encounter (Signed)
Needs clarification for dosage of Verapimil

## 2013-10-29 NOTE — Telephone Encounter (Signed)
Rx was sent to pharmacy electronically. 

## 2013-11-03 ENCOUNTER — Other Ambulatory Visit: Payer: Self-pay | Admitting: *Deleted

## 2013-11-03 MED ORDER — VERAPAMIL HCL ER 240 MG PO TBCR: 240.0000 mg | EXTENDED_RELEASE_TABLET | Freq: Every day | ORAL | Status: DC

## 2013-11-10 ENCOUNTER — Telehealth: Payer: Self-pay | Admitting: Cardiovascular Disease

## 2013-11-10 NOTE — Telephone Encounter (Signed)
Pt called in stating that her pharmacy needed a new rx for Verapamil in order for it to be refilled. She also said that it needs to be sent to [facility] pharmacy. Please call  Thanks

## 2013-11-10 NOTE — Telephone Encounter (Signed)
Patient notified that medication was refilled on 8/17. Suggested she call the pharmacy to see when it shipped.

## 2013-12-15 ENCOUNTER — Telehealth: Payer: Self-pay | Admitting: Cardiovascular Disease

## 2013-12-15 NOTE — Telephone Encounter (Signed)
Need a refill on her Levothyroxine 50 mg #30. Please call to [phone]. Please call this in today if possible.

## 2013-12-15 NOTE — Telephone Encounter (Signed)
Last refill in EPIC from 12/2012 - patient states MD name on pill bottle is Thurmon Fair, MD >> although no refills have been done in almost 1 year.   Informed patient last TSH was from 07/2012  Deferred to Dr. Salena Saner to advise Patient states she gets NO meds from PCP, when she was advised to contact them for refills

## 2013-12-15 NOTE — Telephone Encounter (Signed)
Ok to refill for 12 months 

## 2013-12-16 MED ORDER — LEVOTHYROXINE SODIUM 50 MCG PO TABS: 50.0000 ug | ORAL_TABLET | Freq: Every day | ORAL | Status: DC

## 2013-12-16 NOTE — Telephone Encounter (Signed)
Pt called again,says she still have not received her medicine. She needs it,would you please call today.

## 2013-12-16 NOTE — Telephone Encounter (Signed)
Spoke with pt, aware script sent into the pharm. 

## 2014-01-26 ENCOUNTER — Telehealth: Payer: Self-pay | Admitting: Cardiovascular Disease

## 2014-01-26 MED ORDER — PRAVASTATIN SODIUM 40 MG PO TABS: 40.0000 mg | ORAL_TABLET | Freq: Every evening | ORAL | Status: DC

## 2014-01-26 NOTE — Telephone Encounter (Signed)
Pt called in stating that she needed a refill on her Pravastatin . She would like a 30 day supply. The pharmacy's number is [phone].  thanks

## 2014-01-26 NOTE — Telephone Encounter (Signed)
Rx refill sent to patient pharmacy   

## 2014-01-28 ENCOUNTER — Telehealth: Payer: Self-pay | Admitting: Cardiovascular Disease

## 2014-01-30 NOTE — Telephone Encounter (Signed)
Closed encounter °

## 2014-02-03 ENCOUNTER — Encounter: Payer: Self-pay | Admitting: Cardiovascular Disease

## 2014-02-03 ENCOUNTER — Ambulatory Visit (INDEPENDENT_AMBULATORY_CARE_PROVIDER_SITE_OTHER): Payer: Medicare HMO | Admitting: *Deleted

## 2014-02-03 ENCOUNTER — Ambulatory Visit (INDEPENDENT_AMBULATORY_CARE_PROVIDER_SITE_OTHER): Payer: Medicare HMO | Admitting: Cardiovascular Disease

## 2014-02-03 VITALS — BP 152/72 | HR 67 | Resp 16 | Ht 62.0 in | Wt 145.2 lb

## 2014-02-03 DIAGNOSIS — Z79899 Other long term (current) drug therapy: Secondary | ICD-10-CM

## 2014-02-03 DIAGNOSIS — Z23 Encounter for immunization: Secondary | ICD-10-CM

## 2014-02-03 DIAGNOSIS — Z950 Presence of cardiac pacemaker: Secondary | ICD-10-CM

## 2014-02-03 DIAGNOSIS — E785 Hyperlipidemia, unspecified: Secondary | ICD-10-CM

## 2014-02-03 DIAGNOSIS — I442 Atrioventricular block, complete: Secondary | ICD-10-CM

## 2014-02-03 DIAGNOSIS — R5383 Other fatigue: Secondary | ICD-10-CM

## 2014-02-03 LAB — LIPID PANEL
CHOL/HDL RATIO: 2.9 ratio
Cholesterol: 160 mg/dL (ref 0–200)
HDL: 56 mg/dL (ref >39)
LDL CALC: 73 mg/dL (ref 0–99)
TRIGLYCERIDES: 153 mg/dL — AB (ref <150)
VLDL: 31 mg/dL (ref 0–40)

## 2014-02-03 LAB — TSH: TSH: 2.594 u[IU]/mL (ref 0.350–4.500)

## 2014-02-03 LAB — MDC_IDC_ENUM_SESS_TYPE_INCLINIC
Battery Voltage: 2.75 V
Brady Statistic RA Percent Paced: 12 %
Implantable Pulse Generator Model: 5816
Implantable Pulse Generator Serial Number: 1810064
Lead Channel Impedance Value: 309 Ohm
Lead Channel Pacing Threshold Amplitude: 0.75 V
Lead Channel Pacing Threshold Pulse Width: 0.4 ms
Lead Channel Pacing Threshold Pulse Width: 0.5 ms
Lead Channel Sensing Intrinsic Amplitude: 2.8 mV
Lead Channel Setting Pacing Pulse Width: 0.5 ms
Lead Channel Setting Sensing Sensitivity: 4 mV
MDC IDC MSMT LEADCHNL RA IMPEDANCE VALUE: 345 Ohm
MDC IDC MSMT LEADCHNL RV PACING THRESHOLD AMPLITUDE: 0.875 V
MDC IDC SET LEADCHNL RA PACING AMPLITUDE: 1.75 V
MDC IDC SET LEADCHNL RV PACING AMPLITUDE: 1.125
MDC IDC STAT BRADY RV PERCENT PACED: 99 % — AB

## 2014-02-03 LAB — COMPREHENSIVE METABOLIC PANEL
ALK PHOS: 46 U/L (ref 39–117)
ALT: 13 U/L (ref 0–35)
AST: 17 U/L (ref 0–37)
Albumin: 4.2 g/dL (ref 3.5–5.2)
BILIRUBIN TOTAL: 0.8 mg/dL (ref 0.2–1.2)
BUN: 16 mg/dL (ref 6–23)
CO2: 29 mEq/L (ref 19–32)
Calcium: 9.9 mg/dL (ref 8.4–10.5)
Chloride: 103 mEq/L (ref 96–112)
Creat: 0.9 mg/dL (ref 0.50–1.10)
Glucose, Bld: 76 mg/dL (ref 70–99)
Potassium: 4 mEq/L (ref 3.5–5.3)
Sodium: 141 mEq/L (ref 135–145)
Total Protein: 7.1 g/dL (ref 6.0–8.3)

## 2014-02-03 MED ORDER — LOSARTAN POTASSIUM 50 MG PO TABS: 50.0000 mg | ORAL_TABLET | Freq: Every day | ORAL | Status: DC

## 2014-02-03 NOTE — Patient Instructions (Addendum)
   Your physician recommends that you return for lab work [facility]  Dr. Royann Shiversroitoru recommends that you schedule a follow-up appointment in: 6 months with device check.

## 2014-02-04 ENCOUNTER — Encounter: Payer: Self-pay | Admitting: Cardiovascular Disease

## 2014-02-04 NOTE — Progress Notes (Signed)
Patient ID: Abigail Melendez, female   DOB: 10/17/1920, 80 y.o.   MRN: [id number]     Reason for office visit Complete heart block, pacemaker check, hypertension, hyperlipidemia  Abigail Melendez is now 80 years old and has a long-standing history of conduction system disease and now has complete heart block and is pacemaker dependent. Her pacemaker was initially implanted in 1991 and we are still using the same leads. Subsequent generator changes in 2000 and 2007. Her device is a Engineer, watert. Jude victory XL DR device. Comprehensive in office check today shows normal device function. She does not have any underlying escape rhythm and is 100% ventricular paced. Only 12% atrial pacing. She has well treated systemic hypertension and hyperlipidemia. Has no complaints today.  Allergies  Allergen Reactions  . Codeine     Current Outpatient Prescriptions  Medication Sig Dispense Refill  . aspirin 81 MG tablet Take 81 mg by mouth daily.    . furosemide (LASIX) 40 MG tablet Take 40 mg by mouth as needed.     Marland Kitchen. levothyroxine (SYNTHROID, LEVOTHROID) 50 MCG tablet Take 1 tablet (50 mcg total) by mouth daily. See PCP for future refills. 90 tablet 3  . losartan (COZAAR) 50 MG tablet Take 1 tablet (50 mg total) by mouth daily. 90 tablet 1  . Multiple Vitamin (MULTIVITAMIN) tablet Take 1 tablet by mouth daily.    . pantoprazole (PROTONIX) 40 MG tablet Take 40 mg by mouth daily.    . pravastatin (PRAVACHOL) 40 MG tablet Take 1 tablet (40 mg total) by mouth every evening. 30 tablet 5  . verapamil (CALAN-SR) 240 MG CR tablet Take 1 tablet (240 mg total) by mouth at bedtime. 90 tablet 3   No current facility-administered medications for this visit.    Past Medical History  Diagnosis Date  . CHF (congestive heart failure)   . Hypertension   . Hyperlipidemia   . Hypothyroidism   . Anxiety   . Complete heart block May 18, 1998    pacemaker implanted  . Pacemaker february 29,2000    St.Jude affinityDR modle#5330  serial#[id number]  . Hypertonicity of bladder   . Osteoporosis   . CHF (congestive heart failure) 08-17-2009 adult echocardiogram    EF 55% mild abnormalities no change in therapy  . Murmur 09-05-2011 adult echocardiogram    EF 55% mild abnormalities; no change in therapy   . Sick sinus syndrome 06-12-2005 persantine cardiolite    normal cardiolite myocardial perfusion study    . Syncope, near 05-13-2003 corotid doppler    mild heterogeneous plaque noted at origins of ica andeca no evidence of significant ica stenosis . vertebral artery flow is antegrade  . Edema leg 10-12-1997 lower extremity venous study    pain and edema x 1 week . no dvt,large non-vascular mass located at the level of the lt. medial knee and extends to the proximal calf  . CHB (complete heart block) 01/22/2013    Pacemaker dependent     Past Surgical History  Procedure Laterality Date  . Pacemaker placement  inital implant 12-10-1989    symtomatic heart block ; non rate responsive pacemaker paragon II generator  . Pacemaker placement  05-18-1998    new DDDR bipolar autocapture,mode switching pacesetter affinity DR pulse generator model number 5330r; serial # [id number]  . Pacemaker placement  02-13-2006    st. Tomi Likensjude Victory XL DR, model [phone]#5816, serial [id number]  . Cardiac catheterization  11-25-1986    lt. heart cath for exertional cp and syncope;coronaries  normal except very minor 20% lesion in lad possible coronary spasm    No family history on file.  History   Social History  . Marital Status: Widowed    Spouse Name: N/A    Number of Children: N/A  . Years of Education: N/A   Occupational History  . Not on file.   Social History Main Topics  . Smoking status: Former Games developer  . Smokeless tobacco: Never Used  . Alcohol Use: Not on file  . Drug Use: Not on file  . Sexual Activity: Not on file   Other Topics Concern  . Not on file   Social History Narrative    Review of systems: The patient specifically denies any  chest pain at rest or with exertion, dyspnea at rest or with exertion, orthopnea, paroxysmal nocturnal dyspnea, syncope, palpitations, focal neurological deficits, intermittent claudication, lower extremity edema, unexplained weight gain, cough, hemoptysis or wheezing.  The patient also denies abdominal pain, nausea, vomiting, dysphagia, diarrhea, constipation, polyuria, polydipsia, dysuria, hematuria, frequency, urgency, abnormal bleeding or bruising, fever, chills, unexpected weight changes, mood swings, change in skin or hair texture, change in voice quality, auditory or visual problems, allergic reactions or rashes, new musculoskeletal complaints other than usual "aches and pains".   PHYSICAL EXAM BP 152/72 mmHg  Pulse 67  Resp 16  Ht 5\' 2"  (1.575 m)  Wt 145 lb 3.2 oz (65.862 kg)  BMI 26.55 kg/m2 General: Alert, oriented x3, no distress  Head: no evidence of trauma, PERRL, EOMI, no exophtalmos or lid lag, no myxedema, no xanthelasma; normal ears, nose and oropharynx  Neck: normal jugular venous pulsations and no hepatojugular reflux; brisk carotid pulses without delay and no carotid bruits  Chest: clear to auscultation, no signs of consolidation by percussion or palpation, normal fremitus, symmetrical and full respiratory excursions; 3 scars of pacemaker generator procedures in the right subclavian area, the site appears healthy  Cardiovascular: normal position and quality of the apical impulse, regular rhythm, normal first and third occipital he split second heart sounds, no rubs or gallops. She has a very faint early peaking systolic ejection murmur in the aortic focus radiating towards the carotids but not really reaching them. I do not appreciate any diastolic murmur of aortic insufficiency  Abdomen: no tenderness or distention, no masses by palpation, no abnormal pulsatility or arterial bruits, normal bowel sounds, no hepatosplenomegaly  Extremities: no clubbing, cyanosis or edema;  2+ radial, ulnar and brachial pulses bilaterally; 2+ right femoral, posterior tibial and dorsalis pedis pulses; 2+ left femoral, posterior tibial and dorsalis pedis pulses; no subclavian or femoral bruits  Neurological: grossly nonfocal   EKG: Atrial paced ventricular sensed  Lipid Panel     Component Value Date/Time   CHOL 160 02/03/2014 1319   TRIG 153* 02/03/2014 1319   HDL 56 02/03/2014 1319   CHOLHDL 2.9 02/03/2014 1319   VLDL 31 02/03/2014 1319   LDLCALC 73 02/03/2014 1319    BMET    Component Value Date/Time   NA 141 02/03/2014 1319   K 4.0 02/03/2014 1319   CL 103 02/03/2014 1319   CO2 29 02/03/2014 1319   GLUCOSE 76 02/03/2014 1319   BUN 16 02/03/2014 1319   CREATININE 0.90 02/03/2014 1319   CALCIUM 9.9 02/03/2014 1319     ASSESSMENT AND PLAN Complete heart block/pacemaker dependent Normal device function, f/u in office in 6 months Excellent lipid profile (borderline TG do not justify more meds) Slightly higher BP than desirable, usually lower - no changes  made to meds.  Orders Placed This Encounter  Procedures  . Comprehensive metabolic panel  . Lipid panel  . TSH  . Implantable device check  . EKG 12-Lead   Meds ordered this encounter  Medications  . losartan (COZAAR) 50 MG tablet    Sig: Take 1 tablet (50 mg total) by mouth daily.    Dispense:  90 tablet    Refill:  1    Jerre Vandrunen  Augustine Brannick, MD, [facility] HospitalFA[facility]urmon FairC [facility] [phone] office [phone] pager

## 2014-02-11 ENCOUNTER — Other Ambulatory Visit: Payer: Self-pay | Admitting: Cardiovascular Disease

## 2014-02-11 NOTE — Telephone Encounter (Signed)
Rx has been sent to the pharmacy electronically. ° °

## 2014-02-16 ENCOUNTER — Encounter: Payer: Self-pay | Admitting: Cardiovascular Disease

## 2014-04-28 ENCOUNTER — Other Ambulatory Visit: Payer: Self-pay | Admitting: Cardiovascular Disease

## 2014-04-28 NOTE — Telephone Encounter (Signed)
Rx(s) sent to pharmacy electronically.  

## 2014-05-20 ENCOUNTER — Other Ambulatory Visit: Payer: Self-pay | Admitting: Cardiovascular Disease

## 2014-05-21 NOTE — Telephone Encounter (Signed)
Levothyroxine refill refused - defer to PCP per last Rx refill

## 2014-05-26 ENCOUNTER — Other Ambulatory Visit: Payer: Self-pay | Admitting: Cardiovascular Disease

## 2014-05-26 NOTE — Addendum Note (Signed)
Addended by: Lindell SparELKINS, Icey Tello M on: 05/26/2014 04:55 PM   Modules accepted: Orders

## 2014-05-26 NOTE — Telephone Encounter (Signed)
Routed to Dr. Royann Shiversroitoru via in-basket refill request to advise on non-cardiac med refill

## 2014-05-26 NOTE — Telephone Encounter (Signed)
Med refill refused with note to pharmacy to defer to PCP Dr. Wylene Simmerisovec

## 2014-05-26 NOTE — Telephone Encounter (Signed)
°  1. Which medications need to be refilled? Levothyroxine  2. Which pharmacy is medication to be sent to? [facility]  3. Do they need a 30 day or 90 day supply? 90  4. Would they like a call back once the medication has been sent to the pharmacy? yes

## 2014-05-26 NOTE — Telephone Encounter (Signed)
PCP please 

## 2014-05-29 ENCOUNTER — Telehealth: Payer: Self-pay | Admitting: Cardiovascular Disease

## 2014-05-29 ENCOUNTER — Other Ambulatory Visit: Payer: Self-pay | Admitting: *Deleted

## 2014-05-29 MED ORDER — LEVOTHYROXINE SODIUM 50 MCG PO TABS: 50.0000 ug | ORAL_TABLET | Freq: Every day | ORAL | Status: DC

## 2014-05-29 NOTE — Telephone Encounter (Signed)
°  1. Which medications need to be refilled? Levothryoxine   2. Which [facility]  3. Do they need a 30 day or 90 day supply? 90  4. Would they like a call back once the medication has been sent to the pharmacy? yes

## 2014-06-01 NOTE — Telephone Encounter (Signed)
Refill sent to pharmacy Friday, attempted to reach patient to inform, no answer on ring.

## 2014-07-22 DIAGNOSIS — E559 Vitamin D deficiency, unspecified: Secondary | ICD-10-CM | POA: Diagnosis not present

## 2014-07-22 DIAGNOSIS — E039 Hypothyroidism, unspecified: Secondary | ICD-10-CM | POA: Diagnosis not present

## 2014-07-22 DIAGNOSIS — Z008 Encounter for other general examination: Secondary | ICD-10-CM | POA: Diagnosis not present

## 2014-07-22 DIAGNOSIS — E785 Hyperlipidemia, unspecified: Secondary | ICD-10-CM | POA: Diagnosis not present

## 2014-07-29 DIAGNOSIS — R8290 Unspecified abnormal findings in urine: Secondary | ICD-10-CM | POA: Diagnosis not present

## 2014-07-29 DIAGNOSIS — E785 Hyperlipidemia, unspecified: Secondary | ICD-10-CM | POA: Diagnosis not present

## 2014-07-29 DIAGNOSIS — R7881 Bacteremia: Secondary | ICD-10-CM | POA: Diagnosis not present

## 2014-07-29 DIAGNOSIS — I10 Essential (primary) hypertension: Secondary | ICD-10-CM | POA: Diagnosis not present

## 2014-07-29 DIAGNOSIS — E559 Vitamin D deficiency, unspecified: Secondary | ICD-10-CM | POA: Diagnosis not present

## 2014-07-29 DIAGNOSIS — D692 Other nonthrombocytopenic purpura: Secondary | ICD-10-CM | POA: Diagnosis not present

## 2014-07-29 DIAGNOSIS — E039 Hypothyroidism, unspecified: Secondary | ICD-10-CM | POA: Diagnosis not present

## 2014-07-29 DIAGNOSIS — Z0000 Encounter for general adult medical examination without abnormal findings: Secondary | ICD-10-CM | POA: Diagnosis not present

## 2014-07-29 DIAGNOSIS — M810 Age-related osteoporosis without current pathological fracture: Secondary | ICD-10-CM | POA: Diagnosis not present

## 2014-07-29 DIAGNOSIS — N183 Chronic kidney disease, stage 3 (moderate): Secondary | ICD-10-CM | POA: Diagnosis not present

## 2014-08-11 ENCOUNTER — Encounter: Payer: Self-pay | Admitting: Cardiovascular Disease

## 2014-08-11 ENCOUNTER — Ambulatory Visit (INDEPENDENT_AMBULATORY_CARE_PROVIDER_SITE_OTHER): Payer: Commercial Managed Care - HMO | Admitting: Cardiovascular Disease

## 2014-08-11 VITALS — BP 128/60 | HR 65 | Ht 63.0 in | Wt 145.0 lb

## 2014-08-11 DIAGNOSIS — Z950 Presence of cardiac pacemaker: Secondary | ICD-10-CM | POA: Diagnosis not present

## 2014-08-11 DIAGNOSIS — I442 Atrioventricular block, complete: Secondary | ICD-10-CM

## 2014-08-11 DIAGNOSIS — I10 Essential (primary) hypertension: Secondary | ICD-10-CM

## 2014-08-11 LAB — CUP PACEART INCLINIC DEVICE CHECK
Battery Impedance: 2500 Ohm
Battery Voltage: 2.76 V
Brady Statistic RA Percent Paced: 4.6 %
Brady Statistic RV Percent Paced: 99 %
Date Time Interrogation Session: 20160524095145
Lead Channel Pacing Threshold Amplitude: 0.875 V
Lead Channel Pacing Threshold Pulse Width: 0.4 ms
Lead Channel Pacing Threshold Pulse Width: 0.5 ms
Lead Channel Sensing Intrinsic Amplitude: 1.8 mV
Lead Channel Setting Pacing Amplitude: 1.75 V
Lead Channel Setting Pacing Pulse Width: 0.5 ms
MDC IDC MSMT LEADCHNL RA IMPEDANCE VALUE: 334 Ohm
MDC IDC MSMT LEADCHNL RA PACING THRESHOLD AMPLITUDE: 0.75 V
MDC IDC MSMT LEADCHNL RV IMPEDANCE VALUE: 321 Ohm
MDC IDC SET LEADCHNL RV SENSING SENSITIVITY: 4 mV
Pulse Gen Serial Number: 1810064

## 2014-08-11 NOTE — Patient Instructions (Addendum)
Your physician recommends that you schedule a follow-up appointment in: 6 months with Dr.Croitoru + pacemaker check (St. Jude).

## 2014-08-11 NOTE — Progress Notes (Signed)
Patient ID: Abigail Melendez, female   DOB: 1921/01/10, 81 y.o.   MRN: [id number]     Cardiology Office Note   Date:  08/11/2014   ID:  Abigail Melendez, DOB 1921/01/10, MRN [id number]  PCP:  Gaspar GarbeISOVEC,RICHARD W, MD  Cardiologist:   Thurmon FairROITORU,Zilpha Mcandrew, MD   Chief Complaint  Patient presents with  . Follow-up    ST pacer check.  No complaints of chest pain or dizziness.  Occas. ankle edema.  Occas. SOB with activity.        History of Present Illness: Abigail Melendez is a 81 y.o. female who presents for Complete heart block, pacemaker check, hypertension, hyperlipidemia  Abigail Melendez is  81 years old and has a long-standing history of conduction system disease and now has complete heart block and is pacemaker dependent. Her pacemaker was initially implanted in 1991 and we are still using the same leads. Subsequent generator changes in 2000 and 2007. Her device is a Engineer, watert. Jude victory XL DR device. Comprehensive in office check today shows normal device function. She does not have any underlying escape rhythm and is 100% ventricular paced. Only 5% atrial pacing. Estimated generates a longevity 2.75-4.25 years. Excellent lead parameters. No detectable R waves. A single mode switch (atrial tachycardia 200 bpm lasting for 1 minute) has been recorded and was asymptomatic. No atrial fibrillation seen. She has well treated systemic hypertension and hyperlipidemia. Has no complaints today.    Past Medical History  Diagnosis Date  . CHF (congestive heart failure)   . Hypertension   . Hyperlipidemia   . Hypothyroidism   . Anxiety   . Complete heart block May 18, 1998    pacemaker implanted  . Pacemaker february 29,2000    St.Jude affinityDR modle#5330 serial#[id number]  . Hypertonicity of bladder   . Osteoporosis   . CHF (congestive heart failure) 08-17-2009 adult echocardiogram    EF 55% mild abnormalities no change in therapy  . Murmur 09-05-2011 adult echocardiogram    EF 55% mild abnormalities; no  change in therapy   . Sick sinus syndrome 06-12-2005 persantine cardiolite    normal cardiolite myocardial perfusion study    . Syncope, near 05-13-2003 corotid doppler    mild heterogeneous plaque noted at origins of ica andeca no evidence of significant ica stenosis . vertebral artery flow is antegrade  . Edema leg 10-12-1997 lower extremity venous study    pain and edema x 1 week . no dvt,large non-vascular mass located at the level of the lt. medial knee and extends to the proximal calf  . CHB (complete heart block) 01/22/2013    Pacemaker dependent     Past Surgical History  Procedure Laterality Date  . Pacemaker placement  inital implant 12-10-1989    symtomatic heart block ; non rate responsive pacemaker paragon II generator  . Pacemaker placement  05-18-1998    new DDDR bipolar autocapture,mode switching pacesetter affinity DR pulse generator model number 5330r; serial # [id number]  . Pacemaker placement  02-13-2006    st. Tomi Likensjude Victory XL DR, model [phone]#5816, serial [id number]  . Cardiac catheterization  11-25-1986    lt. heart cath for exertional cp and syncope;coronaries normal except very minor 20% lesion in lad possible coronary spasm     Current Outpatient Prescriptions  Medication Sig Dispense Refill  . alendronate (FOSAMAX) 70 MG tablet Take 70 mg by mouth once a week. Take with a full glass of water on an empty stomach.    Marland Kitchen. aspirin 81 MG  tablet Take 81 mg by mouth daily.    . cephALEXin (KEFLEX) 250 MG capsule Take 1 capsule by mouth 3 (three) times daily.  0  . furosemide (LASIX) 40 MG tablet Take 40 mg by mouth daily as needed.    Marland Kitchen levothyroxine (SYNTHROID, LEVOTHROID) 50 MCG tablet Take 1 tablet (50 mcg total) by mouth daily. See PCP for future refills. 90 tablet 2  . losartan (COZAAR) 50 MG tablet Take 1 tablet (50 mg total) by mouth daily. 90 tablet 2  . Multiple Vitamin (MULTIVITAMIN) tablet Take 1 tablet by mouth daily.    . pantoprazole (PROTONIX) 40 MG tablet Take 40 mg by  mouth daily.    . pravastatin (PRAVACHOL) 40 MG tablet TAKE 1 TABLET EVERY EVENING  .  STOP  SIMVASTATIN 90 tablet 1  . verapamil (CALAN-SR) 240 MG CR tablet Take 1 tablet (240 mg total) by mouth at bedtime. 90 tablet 3   No current facility-administered medications for this visit.    Allergies:   Codeine    Social History:  The patient  reports that she has quit smoking. She has never used smokeless tobacco.    ROS:  Please see the history of present illness.    Otherwise, review of systems positive for occasional ankle swelling and infrequent exertional dyspnea.   All other systems are reviewed and negative.    PHYSICAL EXAM: VS:  BP 128/60 mmHg  Pulse 65  Ht  (1.6 m)  Wt 65.772 kg (145 lb)  BMI 25.69 kg/m2 , BMI Body mass index is 25.69 kg/(m^2).  General: Alert, oriented x3, no distress Head: no evidence of trauma, PERRL, EOMI, no exophtalmos or lid lag, no myxedema, no xanthelasma; normal ears, nose and oropharynx Neck: normal jugular venous pulsations and no hepatojugular reflux; brisk carotid pulses without delay and no carotid bruits Chest: clear to auscultation, no signs of consolidation by percussion or palpation, normal fremitus, symmetrical and full respiratory excursions Cardiovascular: normal position and quality of the apical impulse, regular rhythm, normal first and paradoxically split second heart sounds, very faint early peaking systolic ejection murmur in the aortic focus, no diastolic murmurs, rubs or gallops Abdomen: no tenderness or distention, no masses by palpation, no abnormal pulsatility or arterial bruits, normal bowel sounds, no hepatosplenomegaly Extremities: no clubbing, cyanosis or edema; 2+ radial, ulnar and brachial pulses bilaterally; 2+ right femoral, posterior tibial and dorsalis pedis pulses; 2+ left femoral, posterior tibial and dorsalis pedis pulses; no subclavian or femoral bruits Neurological: grossly nonfocal Psych: euthymic mood, full  affect   EKG:  EKG is ordered today. The ekg ordered today demonstrates atrial sensed (sinus) ventricular paced rhythm   Recent Labs: 02/03/2014: ALT 13; BUN 16; Creatinine 0.90; Potassium 4.0; Sodium 141; TSH 2.594    Lipid Panel    Component Value Date/Time   CHOL 160 02/03/2014 1319   TRIG 153* 02/03/2014 1319   HDL 56 02/03/2014 1319   CHOLHDL 2.9 02/03/2014 1319   VLDL 31 02/03/2014 1319   LDLCALC 73 02/03/2014 1319      Wt Readings from Last 3 Encounters:  08/11/14 65.772 kg (145 lb)  02/03/14 65.862 kg (145 lb 3.2 oz)  07/28/13 66.18 kg (145 lb 14.4 oz)       ASSESSMENT AND PLAN:  Complete heart block/pacemaker dependent Normal device function, f/u in office in 6 months Excellent lipid profile (borderline TG do not justify more meds) Good BP control - no changes made to meds.   Current medicines are  reviewed at length with the patient today.  The patient does not have concerns regarding medicines.  The following changes have been made:  no change  Labs/ tests ordered today include:   Orders Placed This Encounter  Procedures  . Implantable device check  . EKG 12-Lead   Patient Instructions  Your physician recommends that you schedule a follow-up appointment in: 6 months with Dr.Keiaira Donlan + pacemaker check (St. Jude).     Joie Bimler, MD  08/11/2014 10:45 AM    Thurmon Fair, MD, [facility] [phone] office [phone] pager

## 2014-08-25 ENCOUNTER — Telehealth: Payer: Self-pay | Admitting: Cardiovascular Disease

## 2014-08-25 NOTE — Telephone Encounter (Signed)
Called, spoke to patient. She explains [facility] [place] [facility] and needs clarification. Requested I call and speak to them.   I called [facility], clarified that Pravastatin 40mg  daily was replacing Simvastatin. Pharmacist acknowledged, script written w/ refills. They will contact pt to notify.

## 2014-08-25 NOTE — Telephone Encounter (Signed)
Please call,concerning her cholesterol medicine.

## 2014-09-01 ENCOUNTER — Encounter: Payer: Self-pay | Admitting: Cardiovascular Disease

## 2015-02-16 ENCOUNTER — Encounter: Payer: Self-pay | Admitting: Cardiovascular Disease

## 2015-02-16 ENCOUNTER — Ambulatory Visit (INDEPENDENT_AMBULATORY_CARE_PROVIDER_SITE_OTHER): Payer: Commercial Managed Care - HMO | Admitting: Cardiovascular Disease

## 2015-02-16 VITALS — BP 134/70 | HR 78 | Resp 16 | Ht 63.0 in | Wt 144.1 lb

## 2015-02-16 DIAGNOSIS — Z950 Presence of cardiac pacemaker: Secondary | ICD-10-CM

## 2015-02-16 DIAGNOSIS — I442 Atrioventricular block, complete: Secondary | ICD-10-CM | POA: Diagnosis not present

## 2015-02-16 DIAGNOSIS — I10 Essential (primary) hypertension: Secondary | ICD-10-CM

## 2015-02-16 MED ORDER — PANTOPRAZOLE SODIUM 40 MG PO TBEC: 40.0000 mg | DELAYED_RELEASE_TABLET | Freq: Every day | ORAL | Status: DC

## 2015-02-16 NOTE — Progress Notes (Signed)
Patient ID: Abigail Melendez, female   DOB: [birth year], 81 y.o.   MRN: [id number]     Cardiology Office Note   Date:  02/16/2015   ID:  Abigail Melendez, Abigail Melendez [birth year], MRN [id number]  PCP:  Gaspar Garbe, MD  Cardiologist:   Thurmon Fair, MD   Chief Complaint  Patient presents with  . Follow-up    no chest pain-only, no shortness of breath,has edema in left left and ankle, occassional pain in legs, occassional cramping in legs, no lightheadedness, no dizziness      History of Present Illness: Abigail Melendez is a 81 y.o. female who presents for  Complete heart block and pacemaker check.   One of her sons passed away a couple of months ago and she is still clearly grieving. She has now lost to over 3 children. She just celebrated her 89nd birthday month ago.   She has no cardiac complaints. She denies syncope, palpitations, lower extremity edema, exertional dyspnea or anginal chest pain. She has noticed recurrence of "heartburn". She is no longer taking her proton pump inhibitor for several months.  She has some swelling in her left ankle which is also tender and looks like she has some bursitis.  Interrogation of her dual-chamber [employer] victory XL DR pacemaker shows normal device function. The device was implanted in 2007 and has roughly 2-3 years of remaining longevity. She is pacemaker dependent due to complete heart block. She has 100% ventricular pacing and only 6.8% atrial pacing. She has normal heart rate histogram distribution. She has rare and brief episodes of mode switch , maximum 22 seconds.   we are still using the pacemaker leads from her initial device implantation 1991 (subsequent generator changes in 2000 and in 2007). She has a bipolar sensing and unipolar pacing. Lead parameters remain excellent.  Past Medical History  Diagnosis Date  . CHF (congestive heart failure) (HCC)   . Hypertension   . Hyperlipidemia   . Hypothyroidism   . Anxiety   . Complete  heart block [facility]) May 18, 1998    pacemaker implanted  . Pacemaker february 29,2000    St.Jude affinityDR modle#5330 serial#[id number]  . Hypertonicity of bladder   . Osteoporosis   . CHF (congestive heart failure) (HCC) 08-17-2009 adult echocardiogram    EF 55% mild abnormalities no change in therapy  . Murmur 09-05-2011 adult echocardiogram    EF 55% mild abnormalities; no change in therapy   . Sick sinus syndrome (HCC) 06-12-2005 persantine cardiolite    normal cardiolite myocardial perfusion study    . Syncope, near 05-13-2003 corotid doppler    mild heterogeneous plaque noted at origins of ica andeca no evidence of significant ica stenosis . vertebral artery flow is antegrade  . Edema leg 10-12-1997 lower extremity venous study    pain and edema x 1 week . no dvt,large non-vascular mass located at the level of the lt. medial knee and extends to the proximal calf  . CHB (complete heart block) (HCC) 01/22/2013    Pacemaker dependent     Past Surgical History  Procedure Laterality Date  . Pacemaker placement  inital implant 12-10-1989    symtomatic heart block ; non rate responsive pacemaker paragon II generator  . Pacemaker placement  05-18-1998    new DDDR bipolar autocapture,mode switching pacesetter affinity DR pulse generator model number 5330r; serial # [id number]  . Pacemaker placement  02-13-2006    st. Tomi Likens DR, model [phone], serial [id number]  .  Cardiac catheterization  11-25-1986    lt. heart cath for exertional cp and syncope;coronaries normal except very minor 20% lesion in lad possible coronary spasm     Current Outpatient Prescriptions  Medication Sig Dispense Refill  . aspirin 81 MG tablet Take 81 mg by mouth daily.    . furosemide (LASIX) 40 MG tablet Take 40 mg by mouth daily as needed.    Marland Kitchen levothyroxine (SYNTHROID, LEVOTHROID) 50 MCG tablet Take 1 tablet (50 mcg total) by mouth daily. See PCP for future refills. 90 tablet 2  . losartan (COZAAR) 50 MG tablet Take 1  tablet (50 mg total) by mouth daily. 90 tablet 2  . Multiple Vitamin (MULTIVITAMIN) tablet Take 1 tablet by mouth daily.    . pantoprazole (PROTONIX) 40 MG tablet Take 1 tablet (40 mg total) by mouth daily. 90 tablet 0  . pravastatin (PRAVACHOL) 40 MG tablet TAKE 1 TABLET EVERY EVENING  .  STOP  SIMVASTATIN 90 tablet 1  . verapamil (CALAN-SR) 240 MG CR tablet Take 1 tablet (240 mg total) by mouth at bedtime. 90 tablet 3   No current facility-administered medications for this visit.    Allergies:   Codeine    Social History:  The patient  reports that she has quit smoking. She has never used smokeless tobacco.    ROS:  Please see the history of present illness.    Otherwise, review of systems positive for none.   All other systems are reviewed and negative.    PHYSICAL EXAM: VS:  BP 134/70 mmHg  Pulse 78  Resp 16  Ht  (1.6 m)  Wt 144 lb 2 oz (65.375 kg)  BMI 25.54 kg/m2 , BMI Body mass index is 25.54 kg/(m^2).  General: Alert, oriented x3, no distress Head: no evidence of trauma, PERRL, EOMI, no exophtalmos or lid lag, no myxedema, no xanthelasma; normal ears, nose and oropharynx Neck: normal jugular venous pulsations and no hepatojugular reflux; brisk carotid pulses without delay and no carotid bruits Chest: clear to auscultation, no signs of consolidation by percussion or palpation, normal fremitus, symmetrical and full respiratory excursions Cardiovascular: normal position and quality of the apical impulse, regular rhythm, normal first and paradoxically split second heart sounds, no murmurs, rubs or gallops Abdomen: no tenderness or distention, no masses by palpation, no abnormal pulsatility or arterial bruits, normal bowel sounds, no hepatosplenomegaly Extremities: no clubbing, cyanosis or edema; 2+ radial, ulnar and brachial pulses bilaterally; 2+ right femoral, posterior tibial and dorsalis pedis pulses; 2+ left femoral, posterior tibial and dorsalis pedis pulses; no  subclavian or femoral bruits Neurological: grossly nonfocal Psych: euthymic mood, full affect   EKG:  EKG is ordered today. The ekg ordered today demonstrates  Atrial sensed, ventricular paced   Recent Labs:  May 2016 creatinine 0.95, potassium 4.7, normal liver function tests, hemoglobin 13, total cholesterol 179, triglycerides 112, HDL 62, LDL 95.   Lipid Panel    Component Value Date/Time   CHOL 160 02/03/2014 1319   TRIG 153* 02/03/2014 1319   HDL 56 02/03/2014 1319   CHOLHDL 2.9 02/03/2014 1319   VLDL 31 02/03/2014 1319   LDLCALC 73 02/03/2014 1319      Wt Readings from Last 3 Encounters:  02/16/15 144 lb 2 oz (65.375 kg)  08/11/14 145 lb (65.772 kg)  02/03/14 145 lb 3.2 oz (65.862 kg)     ASSESSMENT AND PLAN:  1.  Complete heart block, pacemaker dependent 2. Normally functioning dual-chamber permanent pacemaker with original leads  from 131991. Current generator is not amenable to remote monitoring. Will check in the office every 6 months until they device enters his last year of estimated longevity. At that point we'll increase the frequency to at least every 3 months. 3.  Hypertension, well treated 4.  Gastroesophageal reflux disease, resume pantoprazole 5.  Hyperlipidemia with satisfactory lipid profile on statin therapy    Current medicines are reviewed at length with the patient today.  The patient does not have concerns regarding medicines.   Labs/ tests ordered today include:  Orders Placed This Encounter  Procedures  . EKG 12-Lead     Patient Instructions  Dr. Royann Shiversroitoru recommends that you schedule a follow-up appointment in: 6 MONTHS WITH PACEMAKER CHECK (ST JUDE).  REFILLS HAVE BEEN SENT TO YOUR PHARMACY X 3 MONTHS.  PLEASE GET FUTURE REFILLS FROM YOUR PRIMARY CARE DOCTOR.       Joie BimlerSigned, Toniqua Melamed, MD  02/16/2015 3:29 PM    Thurmon FairMihai Raheel Kunkle, MD, [facility] [phone] office [phone] pager

## 2015-02-16 NOTE — Patient Instructions (Signed)
Dr. Royann Shiversroitoru recommends that you schedule a follow-up appointment in: 6 MONTHS WITH PACEMAKER CHECK (ST JUDE).  REFILLS HAVE BEEN SENT TO YOUR PHARMACY X 3 MONTHS.  PLEASE GET FUTURE REFILLS FROM YOUR PRIMARY CARE DOCTOR.

## 2015-02-25 LAB — CUP PACEART INCLINIC DEVICE CHECK
Date Time Interrogation Session: 20161208110243
Lead Channel Setting Pacing Amplitude: 1.75 V
Lead Channel Setting Pacing Pulse Width: 0.5 ms
MDC IDC LEAD IMPLANT DT: 19910923
MDC IDC LEAD IMPLANT DT: 19910923
MDC IDC LEAD LOCATION: 753859
MDC IDC LEAD LOCATION: 753860
MDC IDC SET LEADCHNL RV SENSING SENSITIVITY: 4 mV
Pulse Gen Model: 5816
Pulse Gen Serial Number: 1810064

## 2015-03-10 ENCOUNTER — Encounter: Payer: Self-pay | Admitting: Cardiovascular Disease

## 2015-05-26 ENCOUNTER — Other Ambulatory Visit: Payer: Self-pay | Admitting: Cardiovascular Disease

## 2015-05-27 NOTE — Telephone Encounter (Signed)
Rx(s) sent to pharmacy electronically.  

## 2015-08-24 ENCOUNTER — Ambulatory Visit (INDEPENDENT_AMBULATORY_CARE_PROVIDER_SITE_OTHER): Payer: Commercial Managed Care - HMO | Admitting: Cardiovascular Disease

## 2015-08-24 ENCOUNTER — Encounter: Payer: Self-pay | Admitting: Cardiovascular Disease

## 2015-08-24 VITALS — BP 149/81 | HR 68 | Ht 63.0 in | Wt 145.0 lb

## 2015-08-24 DIAGNOSIS — I442 Atrioventricular block, complete: Secondary | ICD-10-CM

## 2015-08-24 DIAGNOSIS — E039 Hypothyroidism, unspecified: Secondary | ICD-10-CM | POA: Diagnosis not present

## 2015-08-24 DIAGNOSIS — Z79899 Other long term (current) drug therapy: Secondary | ICD-10-CM

## 2015-08-24 DIAGNOSIS — Z950 Presence of cardiac pacemaker: Secondary | ICD-10-CM

## 2015-08-24 DIAGNOSIS — E785 Hyperlipidemia, unspecified: Secondary | ICD-10-CM | POA: Diagnosis not present

## 2015-08-24 LAB — CUP PACEART INCLINIC DEVICE CHECK
Battery Impedance: 3700 Ohm
Implantable Lead Implant Date: 19910923
Implantable Lead Implant Date: 19910923
Implantable Lead Location: 753860
Lead Channel Impedance Value: 318 Ohm
Lead Channel Pacing Threshold Amplitude: 0.875 V
Lead Channel Pacing Threshold Pulse Width: 0.4 ms
Lead Channel Pacing Threshold Pulse Width: 0.5 ms
Lead Channel Setting Pacing Amplitude: 1.75 V
Lead Channel Setting Sensing Sensitivity: 4 mV
MDC IDC LEAD LOCATION: 753859
MDC IDC MSMT BATTERY VOLTAGE: 2.74 V
MDC IDC MSMT LEADCHNL RA IMPEDANCE VALUE: 254 Ohm
MDC IDC MSMT LEADCHNL RA PACING THRESHOLD AMPLITUDE: 0.5 V
MDC IDC MSMT LEADCHNL RA SENSING INTR AMPL: 1.4 mV
MDC IDC PG SERIAL: 1810064
MDC IDC SESS DTM: 20170606112525
MDC IDC SET LEADCHNL RV PACING PULSEWIDTH: 0.5 ms

## 2015-08-24 LAB — COMPREHENSIVE METABOLIC PANEL
ALK PHOS: 52 U/L (ref 33–130)
ALT: 11 U/L (ref 6–29)
AST: 17 U/L (ref 10–35)
Albumin: 4.1 g/dL (ref 3.6–5.1)
BILIRUBIN TOTAL: 0.8 mg/dL (ref 0.2–1.2)
BUN: 21 mg/dL (ref 7–25)
CALCIUM: 9.5 mg/dL (ref 8.6–10.4)
CO2: 27 mmol/L (ref 20–31)
Chloride: 104 mmol/L (ref 98–110)
Creat: 0.96 mg/dL — ABNORMAL HIGH (ref 0.60–0.88)
GLUCOSE: 84 mg/dL (ref 65–99)
Potassium: 4.6 mmol/L (ref 3.5–5.3)
Sodium: 142 mmol/L (ref 135–146)
TOTAL PROTEIN: 6.7 g/dL (ref 6.1–8.1)

## 2015-08-24 LAB — LIPID PANEL
Cholesterol: 158 mg/dL (ref 125–200)
HDL: 52 mg/dL (ref >=46)
LDL Cholesterol: 71 mg/dL (ref <130)
Total CHOL/HDL Ratio: 3 Ratio (ref <=5.0)
Triglycerides: 174 mg/dL — ABNORMAL HIGH (ref <150)
VLDL: 35 mg/dL — ABNORMAL HIGH (ref <30)

## 2015-08-24 LAB — CBC
HEMATOCRIT: 39.3 % (ref 35.0–45.0)
Hemoglobin: 13.3 g/dL (ref 11.7–15.5)
MCH: 29.7 pg (ref 27.0–33.0)
MCHC: 33.8 g/dL (ref 32.0–36.0)
MCV: 87.7 fL (ref 80.0–100.0)
MPV: 10 fL (ref 7.5–12.5)
PLATELETS: 171 10*3/uL (ref 140–400)
RBC: 4.48 MIL/uL (ref 3.80–5.10)
RDW: 13.4 % (ref 11.0–15.0)
WBC: 5.9 10*3/uL (ref 3.8–10.8)

## 2015-08-24 LAB — TSH: TSH: 4.84 m[IU]/L — AB

## 2015-08-24 MED ORDER — LEVOTHYROXINE SODIUM 50 MCG PO TABS: 50.0000 ug | ORAL_TABLET | Freq: Every day | ORAL | Status: DC

## 2015-08-24 MED ORDER — PRAVASTATIN SODIUM 40 MG PO TABS: 40.0000 mg | ORAL_TABLET | Freq: Every day | ORAL | Status: DC

## 2015-08-24 NOTE — Progress Notes (Signed)
Patient ID: Abigail ChuteFannie M Zapanta, female   DOB: 07/09/1920, 82 y.o.   MRN: [id number]    Cardiology Office Note    Date:  08/26/2015   ID:  Abigail Melendez, Park MeoDOB 07/09/1920, MRN [id number]  PCP:  Gaspar Garbeichard W Tisovec, MD  Cardiologist:   Thurmon FairMihai Aujanae Mccullum, MD   Chief Complaint  Patient presents with  . 6 MONTH PACER CHECK  . Shortness of Breath  . Edema    ANKLES    History of Present Illness:  Abigail Melendez is a 82 y.o. female with Complete heart block here for a pacemaker check.  The patient specifically denies any chest pain at rest exertion, dyspnea at rest or with exertion, orthopnea, paroxysmal nocturnal dyspnea, syncope, palpitations, focal neurological deficits, intermittent claudication, lower extremity edema, unexplained weight gain, cough, hemoptysis or wheezing.  Interrogation of her dual-chamber [employer]. Jude Medical victory XL DR pacemaker shows normal device function. Initial device implantation 1991 (subsequent generator changes in 2000 and in 2007). She has a bipolar sensing and unipolar pacing. Lead parameters remain excellent.The device has roughly 2.25-3 years of remaining longevity. She is pacemaker dependent due to complete heart block. She has 100% ventricular pacing and only 6% atrial pacing. She has normal heart rate histogram distribution. She has rare and brief episodes of mode switch due to PAT , maximum 5 minutes.   Past Medical History  Diagnosis Date  . CHF (congestive heart failure) (HCC)   . Hypertension   . Hyperlipidemia   . Hypothyroidism   . Anxiety   . Complete heart block [facility](HCC) May 18, 1998    pacemaker implanted  . Pacemaker february 29,2000    St.Jude affinityDR modle#5330 serial#[id number]  . Hypertonicity of bladder   . Osteoporosis   . CHF (congestive heart failure) (HCC) 08-17-2009 adult echocardiogram    EF 55% mild abnormalities no change in therapy  . Murmur 09-05-2011 adult echocardiogram    EF 55% mild abnormalities; no change in therapy   . Sick  sinus syndrome (HCC) 06-12-2005 persantine cardiolite    normal cardiolite myocardial perfusion study    . Syncope, near 05-13-2003 corotid doppler    mild heterogeneous plaque noted at origins of ica andeca no evidence of significant ica stenosis . vertebral artery flow is antegrade  . Edema leg 10-12-1997 lower extremity venous study    pain and edema x 1 week . no dvt,large non-vascular mass located at the level of the lt. medial knee and extends to the proximal calf  . CHB (complete heart block) (HCC) 01/22/2013    Pacemaker dependent     Past Surgical History  Procedure Laterality Date  . Pacemaker placement  inital implant 12-10-1989    symtomatic heart block ; non rate responsive pacemaker paragon II generator  . Pacemaker placement  05-18-1998    new DDDR bipolar autocapture,mode switching pacesetter affinity DR pulse generator model number 5330r; serial # [id number]  . Pacemaker placement  02-13-2006    st. Tomi Likensjude Victory XL DR, model [phone]#[id number], serial [id number]  . Cardiac catheterization  11-25-1986    lt. heart cath for exertional cp and syncope;coronaries normal except very minor 20% lesion in lad possible coronary spasm    Current Medications: Outpatient Prescriptions Prior to Visit  Medication Sig Dispense Refill  . aspirin 81 MG tablet Take 81 mg by mouth daily.    . furosemide (LASIX) 40 MG tablet Take 40 mg by mouth daily as needed.    Marland Kitchen. losartan (COZAAR) 50 MG tablet Take 1  tablet (50 mg total) by mouth daily. 90 tablet 2  . Multiple Vitamin (MULTIVITAMIN) tablet Take 1 tablet by mouth daily.    . pantoprazole (PROTONIX) 40 MG tablet Take 1 tablet (40 mg total) by mouth daily. 90 tablet 2  . verapamil (CALAN-SR) 240 MG CR tablet Take 1 tablet (240 mg total) by mouth at bedtime. 90 tablet 3  . levothyroxine (SYNTHROID, LEVOTHROID) 50 MCG tablet Take 1 tablet (50 mcg total) by mouth daily. See PCP for future refills. 90 tablet 2  . pravastatin (PRAVACHOL) 40 MG tablet TAKE 1 TABLET EVERY  EVENING  .  STOP  SIMVASTATIN 90 tablet 1   No facility-administered medications prior to visit.     Allergies:   Codeine   Social History   Social History  . Marital Status: Widowed    Spouse Name: N/A  . Number of Children: N/A  . Years of Education: N/A   Social History Main Topics  . Smoking status: Former Games developer  . Smokeless tobacco: Never Used  . Alcohol Use: None  . Drug Use: None  . Sexual Activity: Not Asked   Other Topics Concern  . None   Social History Narrative      ROS:   Please see the history of present illness.    ROS All other systems reviewed and are negative.   PHYSICAL EXAM:   VS:  BP 149/81 mmHg  Pulse 68  Ht  (1.6 m)  Wt 65.772 kg (145 lb)  BMI 25.69 kg/m2   GEN: Well nourished, well developed, in no acute distress HEENT: normal Neck: no JVD, carotid bruits, or masses Cardiac: paradoxically split S2; RRR; no murmurs, rubs, or gallops,no edema ; healthy subclavian PM site Respiratory:  clear to auscultation bilaterally, normal work of breathing GI: soft, nontender, nondistended, + BS MS: no deformity or atrophy Skin: warm and dry, no rash Neuro:  Alert and Oriented x 3, Strength and sensation are intact Psych: euthymic mood, full affect  Wt Readings from Last 3 Encounters:  08/24/15 65.772 kg (145 lb)  02/16/15 65.375 kg (144 lb 2 oz)  08/11/14 65.772 kg (145 lb)      Studies/Labs Reviewed:   EKG:  EKG is ordered today.  The ekg ordered today demonstrates A sensed, V paced rhythm with AV delay 158 ms  Recent Labs: 08/24/2015: ALT 11; BUN 21; Creat 0.96*; Hemoglobin 13.3; Platelets 171; Potassium 4.6; Sodium 142; TSH 4.84*   Lipid Panel    Component Value Date/Time   CHOL 158 08/24/2015 1149   TRIG 174* 08/24/2015 1149   HDL 52 08/24/2015 1149   CHOLHDL 3.0 08/24/2015 1149   VLDL 35* 08/24/2015 1149   LDLCALC 71 08/24/2015 1149     ASSESSMENT:    1. CHB (complete heart block) (HCC)   2. Pacemaker - dual chamber St  Jude, implanted 1991, generator change 2007   3. Hypothyroidism, unspecified hypothyroidism type   4. Dyslipidemia   5. Medication management      PLAN:  In order of problems listed above:  1. CHB: pacemaker dependent 2. PPM: normal device function, not amenable to remote checks. Change to every 3 month checks when we reach last 12 months of anticipated service 3. Check TSH, complains of fatigue. 4. Check lipids.    Medication Adjustments/Labs and Tests Ordered: Current medicines are reviewed at length with the patient today.  Concerns regarding medicines are outlined above.  Medication changes, Labs and Tests ordered today are listed in the Patient  Instructions below. Patient Instructions  Medication Instructions: Dr Royann Shivers recommends that you continue on your current medications as directed. Please refer to the Current Medication list given to you today.  Labwork: Your physician recommends that you return for lab work at your earliest convenience - FASTING.  Testing/Procedures: NONE ORDERED  Follow-up: Dr Royann Shivers recommends that you schedule a follow-up appointment in 6 months with a pacer check. You will receive a reminder letter in the mail two months in advance. If you don't receive a letter, please call our office to schedule the follow-up appointment.  If you need a refill on your cardiac medications before your next appointment, please call your pharmacy.    Signed, Thurmon Fair, MD  08/26/2015 12:40 PM    [facility] [address] [place], [place], [place]  [address] Phone: [phone]; Fax: [phone]

## 2015-08-24 NOTE — Patient Instructions (Signed)
Medication Instructions: Dr Royann Shiversroitoru recommends that you continue on your current medications as directed. Please refer to the Current Medication list given to you today.  Labwork: Your physician recommends that you return for lab work at your earliest convenience - FASTING.  Testing/Procedures: NONE ORDERED  Follow-up: Dr Royann Shiversroitoru recommends that you schedule a follow-up appointment in 6 months with a pacer check. You will receive a reminder letter in the mail two months in advance. If you don't receive a letter, please call our office to schedule the follow-up appointment.  If you need a refill on your cardiac medications before your next appointment, please call your pharmacy.

## 2015-08-30 ENCOUNTER — Encounter: Payer: Self-pay | Admitting: Cardiovascular Disease

## 2015-09-02 ENCOUNTER — Other Ambulatory Visit: Payer: Self-pay | Admitting: Cardiovascular Disease

## 2015-09-02 NOTE — Telephone Encounter (Signed)
Rx(s) sent to pharmacy electronically.  

## 2015-09-06 ENCOUNTER — Other Ambulatory Visit: Payer: Self-pay | Admitting: *Deleted

## 2015-09-06 MED ORDER — VERAPAMIL HCL ER 240 MG PO TBCR: 240.0000 mg | EXTENDED_RELEASE_TABLET | Freq: Every day | ORAL | Status: DC

## 2015-09-07 ENCOUNTER — Telehealth: Payer: Self-pay | Admitting: Cardiovascular Disease

## 2015-09-07 MED ORDER — VERAPAMIL HCL ER 240 MG PO TBCR: 240.0000 mg | EXTENDED_RELEASE_TABLET | Freq: Every day | ORAL | Status: DC

## 2015-09-07 NOTE — Telephone Encounter (Signed)
New message   *STAT* If patient is at the pharmacy, call can be transferred to refill team.   1. Which medications need to be refilled? (please list name of each medication and dose if known) verapamil  240 mg   2. Which pharmacy/location (including street and city if local pharmacy) is medication to be sent to? [facility]   [phone]   3. Do they need a 30 day or 90 day supply? 30 day

## 2015-09-07 NOTE — Telephone Encounter (Signed)
Rx has been sent to the pharmacy electronically. ° °

## 2015-10-12 DIAGNOSIS — E559 Vitamin D deficiency, unspecified: Secondary | ICD-10-CM | POA: Diagnosis not present

## 2015-10-12 DIAGNOSIS — E038 Other specified hypothyroidism: Secondary | ICD-10-CM | POA: Diagnosis not present

## 2015-10-12 DIAGNOSIS — E7800 Pure hypercholesterolemia, unspecified: Secondary | ICD-10-CM | POA: Diagnosis not present

## 2015-10-12 DIAGNOSIS — I10 Essential (primary) hypertension: Secondary | ICD-10-CM | POA: Diagnosis not present

## 2015-10-19 DIAGNOSIS — Z0000 Encounter for general adult medical examination without abnormal findings: Secondary | ICD-10-CM | POA: Diagnosis not present

## 2015-10-19 DIAGNOSIS — N183 Chronic kidney disease, stage 3 (moderate): Secondary | ICD-10-CM | POA: Diagnosis not present

## 2015-10-19 DIAGNOSIS — B359 Dermatophytosis, unspecified: Secondary | ICD-10-CM | POA: Diagnosis not present

## 2015-10-19 DIAGNOSIS — N390 Urinary tract infection, site not specified: Secondary | ICD-10-CM | POA: Diagnosis not present

## 2015-10-19 DIAGNOSIS — T148 Other injury of unspecified body region: Secondary | ICD-10-CM | POA: Diagnosis not present

## 2015-10-19 DIAGNOSIS — R8299 Other abnormal findings in urine: Secondary | ICD-10-CM | POA: Diagnosis not present

## 2015-10-19 DIAGNOSIS — I422 Other hypertrophic cardiomyopathy: Secondary | ICD-10-CM | POA: Diagnosis not present

## 2015-10-19 DIAGNOSIS — D692 Other nonthrombocytopenic purpura: Secondary | ICD-10-CM | POA: Diagnosis not present

## 2015-10-19 DIAGNOSIS — I130 Hypertensive heart and chronic kidney disease with heart failure and stage 1 through stage 4 chronic kidney disease, or unspecified chronic kidney disease: Secondary | ICD-10-CM | POA: Diagnosis not present

## 2015-10-19 DIAGNOSIS — N318 Other neuromuscular dysfunction of bladder: Secondary | ICD-10-CM | POA: Diagnosis not present

## 2016-01-25 ENCOUNTER — Other Ambulatory Visit: Payer: Self-pay | Admitting: Cardiovascular Disease

## 2016-03-27 ENCOUNTER — Ambulatory Visit (INDEPENDENT_AMBULATORY_CARE_PROVIDER_SITE_OTHER): Payer: Medicare HMO | Admitting: Cardiovascular Disease

## 2016-03-27 ENCOUNTER — Encounter: Payer: Self-pay | Admitting: Cardiovascular Disease

## 2016-03-27 VITALS — BP 167/70 | HR 70 | Ht 63.0 in | Wt 143.8 lb

## 2016-03-27 DIAGNOSIS — Z950 Presence of cardiac pacemaker: Secondary | ICD-10-CM

## 2016-03-27 DIAGNOSIS — I10 Essential (primary) hypertension: Secondary | ICD-10-CM | POA: Diagnosis not present

## 2016-03-27 DIAGNOSIS — I5032 Chronic diastolic (congestive) heart failure: Secondary | ICD-10-CM

## 2016-03-27 DIAGNOSIS — I442 Atrioventricular block, complete: Secondary | ICD-10-CM

## 2016-03-27 DIAGNOSIS — Z23 Encounter for immunization: Secondary | ICD-10-CM

## 2016-03-27 DIAGNOSIS — E782 Mixed hyperlipidemia: Secondary | ICD-10-CM | POA: Diagnosis not present

## 2016-03-27 NOTE — Patient Instructions (Signed)
Dr Croitoru recommends that you schedule a follow-up appointment in 6 months. You will receive a reminder letter in the mail two months in advance. If you don't receive a letter, please call our office to schedule the follow-up appointment.  If you need a refill on your cardiac medications before your next appointment, please call your pharmacy. 

## 2016-03-27 NOTE — Progress Notes (Signed)
Patient ID: Abigail Melendez, female   DOB: 05/18/1920, 83 y.o.   MRN: [id number]    Cardiology Office Note    Date:  03/28/2016   ID:  Abigail Melendez, Park MeoDOB 05/18/1920, MRN [id number]  PCP:  Gaspar Garbeichard W Tisovec, MD  Cardiologist:   Thurmon FairMihai Caisen Mangas, MD   Chief Complaint  Patient presents with  . Follow-up    pt c/o ankle swelling     History of Present Illness:  Abigail Melendez is a 83 y.o. female with Complete heart block here for a pacemaker check.  The patient specifically denies any chest pain at rest exertion, dyspnea at rest or with exertion, orthopnea, paroxysmal nocturnal dyspnea, syncope, palpitations, focal neurological deficits, intermittent claudication, lower extremity edema, unexplained weight gain, cough, hemoptysis or wheezing.  Interrogation of her dual-chamber [employer] victory XL DR pacemaker shows normal device function. Initial device implantation 1991 (subsequent generator changes in 2000 and in 2007). Device programmed for bipolar sensing and unipolar pacing. Lead parameters remain excellent.The device has roughly 2.0-2.75 years of remaining longevity. She is pacemaker dependent due to complete heart block. She has 100% ventricular pacing and only 8% atrial pacing. She has normal heart rate histogram distribution. She has rare and brief episodes of mode switch due to PAT , maximum 30 seconds.  Abigail Melendez has well treated systemic hypertension and hyperlipidemia. Her blood pressure was a little high when she initially checked in today but when rechecked her blood pressure was better at 141/60 mmHg. She takes a statin for hyperlipidemia. She bears a diagnosis of diastolic heart failure, for which she takes furosemide and losartan. She has not had problems with shortness of breath but if she skips her furosemide she does develop ankle swelling. Her last echo in 2013 showed normal LVEF moderate LVH, abnormal relaxation, no serious valvular abnormalities.   Past Medical History:    Diagnosis Date  . Anxiety   . CHB (complete heart block) (HCC) 01/22/2013   Pacemaker dependent   . CHF (congestive heart failure) (HCC)   . CHF (congestive heart failure) (HCC) 08-17-2009 adult echocardiogram   EF 55% mild abnormalities no change in therapy  . Complete heart block [facility](HCC) May 18, 1998   pacemaker implanted  . Edema leg 10-12-1997 lower extremity venous study   pain and edema x 1 week . no dvt,large non-vascular mass located at the level of the lt. medial knee and extends to the proximal calf  . Hyperlipidemia   . Hypertension   . Hypertonicity of bladder   . Hypothyroidism   . Murmur 09-05-2011 adult echocardiogram   EF 55% mild abnormalities; no change in therapy   . Osteoporosis   . Pacemaker february 29,2000   St.Jude affinityDR modle#5330 serial#[id number]  . Sick sinus syndrome (HCC) 06-12-2005 persantine cardiolite   normal cardiolite myocardial perfusion study    . Syncope, near 05-13-2003 corotid doppler   mild heterogeneous plaque noted at origins of ica andeca no evidence of significant ica stenosis . vertebral artery flow is antegrade    Past Surgical History:  Procedure Laterality Date  . CARDIAC CATHETERIZATION  11-25-1986   lt. heart cath for exertional cp and syncope;coronaries normal except very minor 20% lesion in lad possible coronary spasm  . PACEMAKER PLACEMENT  inital implant 12-10-1989   symtomatic heart block ; non rate responsive pacemaker paragon II generator  . PACEMAKER PLACEMENT  05-18-1998   new DDDR bipolar autocapture,mode switching pacesetter affinity DR pulse generator model number 5330r; serial #  [id number]  . PACEMAKER PLACEMENT  02-13-2006   st. Tomi Likens DR, model [phone], serial [phone]    Current Medications: Outpatient Medications Prior to Visit  Medication Sig Dispense Refill  . aspirin 81 MG tablet Take 81 mg by mouth daily.    . furosemide (LASIX) 40 MG tablet Take 40 mg by mouth daily as needed.    Marland Kitchen levothyroxine  (SYNTHROID, LEVOTHROID) 50 MCG tablet Take 1 tablet (50 mcg total) by mouth daily. 90 tablet 3  . losartan (COZAAR) 50 MG tablet TAKE 1 TABLET EVERY DAY 90 tablet 3  . Multiple Vitamin (MULTIVITAMIN) tablet Take 1 tablet by mouth daily.    . pantoprazole (PROTONIX) 40 MG tablet Take 1 tablet (40 mg total) by mouth daily. 90 tablet 1  . pravastatin (PRAVACHOL) 40 MG tablet Take 1 tablet (40 mg total) by mouth daily. 90 tablet 3  . verapamil (CALAN-SR) 240 MG CR tablet Take 1 tablet (240 mg total) by mouth at bedtime. 30 tablet 1   No facility-administered medications prior to visit.      Allergies:   Codeine   Social History   Social History  . Marital status: Widowed    Spouse name: N/A  . Number of children: N/A  . Years of education: N/A   Social History Main Topics  . Smoking status: Former Games developer  . Smokeless tobacco: Never Used  . Alcohol use Not on file  . Drug use: Unknown  . Sexual activity: Not on file   Other Topics Concern  . Not on file   Social History Narrative  . No narrative on file      ROS:   Please see the history of present illness.    ROS All other systems reviewed and are negative.   PHYSICAL EXAM:   VS:  BP (!) 167/70   Pulse 70   Ht 5\' 3"  (1.6 m)   Wt 143 lb 12.8 oz (65.2 kg)   BMI 25.47 kg/m    GEN: Well nourished, well developed, in no acute distress  HEENT: normal  Neck: no JVD, carotid bruits, or masses Cardiac: paradoxically split S2; RRR; 1-2/6 aortic ejection murmur, no diastolic murmurs, rubs, or gallops,no edema ; healthy R subclavian PM site Respiratory:  clear to auscultation bilaterally, normal work of breathing GI: soft, nontender, nondistended, + BS MS: no deformity or atrophy  Skin: warm and dry, no rash Neuro:  Alert and Oriented x 3, Strength and sensation are intact Psych: euthymic mood, full affect  Wt Readings from Last 3 Encounters:  03/27/16 143 lb 12.8 oz (65.2 kg)  08/24/15 145 lb (65.8 kg)  02/16/15 144 lb 2  oz (65.4 kg)      Studies/Labs Reviewed:   EKG:  EKG is ordered today.  The ekg ordered today demonstrates A sensed, V paced rhythm with AV delay 158 ms  Recent Labs: 08/24/2015: ALT 11; BUN 21; Creat 0.96; Hemoglobin 13.3; Platelets 171; Potassium 4.6; Sodium 142; TSH 4.84   Lipid Panel    Component Value Date/Time   CHOL 158 08/24/2015 1149   TRIG 174 (H) 08/24/2015 1149   HDL 52 08/24/2015 1149   CHOLHDL 3.0 08/24/2015 1149   VLDL 35 (H) 08/24/2015 1149   LDLCALC 71 08/24/2015 1149     ASSESSMENT:    1. CHB (complete heart block) (HCC)   2. Pacemaker   3. Mixed hyperlipidemia   4. Essential hypertension   5. Chronic diastolic heart failure (HCC)   6. Encounter  for immunization      PLAN:  In order of problems listed above:  1. CHB: pacemaker dependent 2. PPM: normal device function, not amenable to remote checks. Continue twice yearly checks in the office, change to every 3 month checks when we reach last 12 months of anticipated service 3. HLP: mildly elevated TG, otherwise good lipid profile. 4. HTN: Elevated today, usually much better. No changes made to medications. 5. CHF: Very well controlled on a relatively low dose of diuretic. NYHA functional class I, euvolemic. 6. Flu shot given today at the patient's request    Medication Adjustments/Labs and Tests Ordered: Current medicines are reviewed at length with the patient today.  Concerns regarding medicines are outlined above.  Medication changes, Labs and Tests ordered today are listed in the Patient Instructions below. Patient Instructions  Dr Royann Shivers recommends that you schedule a follow-up appointment in 6 months. You will receive a reminder letter in the mail two months in advance. If you don't receive a letter, please call our office to schedule the follow-up appointment.  If you need a refill on your cardiac medications before your next appointment, please call your pharmacy.    Signed, Thurmon Fair, MD  03/28/2016 11:57 AM    [facility] [address] [place], [place], [place]  [address] Phone: [phone]; Fax: [phone]

## 2016-03-28 ENCOUNTER — Encounter: Payer: Self-pay | Admitting: Cardiovascular Disease

## 2016-03-29 LAB — CUP PACEART INCLINIC DEVICE CHECK
Date Time Interrogation Session: 20180110085525
Implantable Lead Implant Date: 19910923
Implantable Lead Location: 753859
Lead Channel Setting Pacing Amplitude: 1.75 V
Lead Channel Setting Pacing Pulse Width: 0.5 ms
MDC IDC LEAD IMPLANT DT: 19910923
MDC IDC LEAD LOCATION: 753860
MDC IDC PG IMPLANT DT: 20071127
MDC IDC PG SERIAL: 1810064
MDC IDC SET LEADCHNL RV SENSING SENSITIVITY: 4 mV
Pulse Gen Model: 5816

## 2016-05-23 ENCOUNTER — Other Ambulatory Visit: Payer: Self-pay | Admitting: Cardiovascular Disease

## 2016-08-07 ENCOUNTER — Telehealth: Payer: Self-pay | Admitting: Cardiovascular Disease

## 2016-08-07 NOTE — Telephone Encounter (Signed)
Closer encounter 

## 2016-08-17 ENCOUNTER — Encounter: Payer: Self-pay | Admitting: Student

## 2016-08-17 NOTE — Progress Notes (Signed)
Cardiology Office Note    Date:  08/18/2016   ID:  Melendez, Abigail [birth year], MRN [id number]  PCP:  Gaspar Garbe, MD  Cardiologist: Dr. Royann Shivers  Chief Complaint  Patient presents with  . Follow-up    History of Present Illness:    Abigail Melendez is a 83 y.o. female with past medical history of CHB (s/p PPM placement in 1991 with subsequent generator changes), chronic diastolic CHF, HTN, and HLD who presents to the office today for 64-month follow-up.    She was last examined by Dr. Royann Shivers in 03/2016 and reported doing well from a cardiac perspective at that time. Her device was interrogated and showed < 1% AT/AF burden with no changes made at that time.   In talking with the patient today, she reports multiple events. The first being that she experienced a mechanical fall approximately 2 months ago. She slipped on her kitchen floor as she was trying to wipe up a spill. She landed on her left hip at that time. She was able to get up and walk around afterwards. Since then she has noticed a swelling and protrusion along her left buttocks. This has been present since her event and continues to cause her pain. No associated symptoms at that time and she denies a loss of consciousness. She did not hit her head.   She also reports an episode of dizziness occurring 2 weeks ago. She had been in her usual state of health throughout the day but after consuming dinner, she experienced an episode of vomiting. Her dizziness occurred after this. Her symptoms resolved within a matter of a few hours and she denies any recurrent events since. No associated palpitations, chest discomfort, dyspnea on exertion, or headaches.  She reports mild lower extremity edema but refuses to take Lasix unless absolutely necessary. No orthopnea or PND.  Past Medical History:  Diagnosis Date  . Anxiety   . CHB (complete heart block) (HCC) 01/22/2013   Pacemaker dependent   . CHF (congestive heart failure)  (HCC)    a. echo in 2013 showed a preserved EF of greater than 55% with no significant valve abnormalities.   . Complete heart block [facility]) May 18, 1998   pacemaker implanted  . Edema leg 10-12-1997 lower extremity venous study   pain and edema x 1 week . no dvt,large non-vascular mass located at the level of the lt. medial knee and extends to the proximal calf  . Hyperlipidemia   . Hypertension   . Hypertonicity of bladder   . Hypothyroidism   . Murmur 09-05-2011 adult echocardiogram   EF 55% mild abnormalities; no change in therapy   . Osteoporosis   . Pacemaker 05/18/1998   St.Jude affinityDR modle#5330 serial#[id number]  . Sick sinus syndrome (HCC)    a. normal cardiolite myocardial perfusion study in 2007.   Marland Kitchen Syncope, near 05-13-2003 corotid doppler   mild heterogeneous plaque noted at origins of ica andeca no evidence of significant ica stenosis . vertebral artery flow is antegrade    Past Surgical History:  Procedure Laterality Date  . CARDIAC CATHETERIZATION  11-25-1986   lt. heart cath for exertional cp and syncope;coronaries normal except very minor 20% lesion in lad possible coronary spasm  . PACEMAKER PLACEMENT  inital implant 12-10-1989   symtomatic heart block ; non rate responsive pacemaker paragon II generator  . PACEMAKER PLACEMENT  05-18-1998   new DDDR bipolar autocapture,mode switching pacesetter affinity DR pulse generator model number 5330r; serial #  [id number]  . PACEMAKER PLACEMENT  02-13-2006   st. Tomi Likens DR, model [phone], serial [phone] [phone]    Current Medications: Outpatient Medications Prior to Visit  Medication Sig Dispense Refill  . aspirin 81 MG tablet Take 81 mg by mouth daily.    . furosemide (LASIX) 40 MG tablet Take 40 mg by mouth daily as needed.    Marland Kitchen losartan (COZAAR) 50 MG tablet TAKE 1 TABLET EVERY DAY 90 tablet 3  . Multiple Vitamin (MULTIVITAMIN) tablet Take 1 tablet by mouth daily.    Marland Kitchen levothyroxine (SYNTHROID, LEVOTHROID) 50 MCG tablet Take  1 tablet (50 mcg total) by mouth daily. 90 tablet 3  . pantoprazole (PROTONIX) 40 MG tablet Take 1 tablet (40 mg total) by mouth daily. 90 tablet 1  . pravastatin (PRAVACHOL) 40 MG tablet Take 1 tablet (40 mg total) by mouth daily. 90 tablet 3  . verapamil (CALAN-SR) 240 MG CR tablet TAKE 1 TABLET AT BEDTIME 90 tablet 1   No facility-administered medications prior to visit.      Allergies:   Codeine   Social History   Social History  . Marital status: Widowed    Spouse name: N/A  . Number of children: N/A  . Years of education: N/A   Social History Main Topics  . Smoking status: Former Games developer  . Smokeless tobacco: Never Used  . Alcohol use None  . Drug use: Unknown  . Sexual activity: Not Asked   Other Topics Concern  . None   Social History Narrative  . None     Family History:  The patient's family history is not on file. Says her parent's passed away of "natural causes".   Review of Systems:   Please see the history of present illness.     General:  No chills, fever, night sweats or weight changes.  Cardiovascular:  No chest pain, dyspnea on exertion, edema, orthopnea, palpitations, paroxysmal nocturnal dyspnea. Dermatological: No rash, lesions/masses Respiratory: No cough, dyspnea Urologic: No hematuria, dysuria MSK: Positive for left hip pain.  Abdominal:   No nausea, vomiting, diarrhea, bright red blood per rectum, melena, or hematemesis Neurologic:  No visual changes, wkns, changes in mental status. Positive for dizziness.   All other systems reviewed and are otherwise negative except as noted above.   Physical Exam:    VS:  BP 130/82   Pulse 67   Ht 5\' 3"  (1.6 m)   Wt 137 lb 3.2 oz (62.2 kg)   SpO2 97%   BMI 24.30 kg/m    General: Well developed, elderly Caucasian female appearing in no acute distress. Head: Normocephalic, atraumatic, sclera non-icteric, no xanthomas, nares are without discharge.  Neck: No carotid bruits. JVD not elevated.  Lungs:  Respirations regular and unlabored, without wheezes or rales.  Heart: Regular rate and rhythm. No S3 or S4.  No murmur, no rubs, or gallops appreciated. Abdomen: Soft, non-tender, non-distended with normoactive bowel sounds. No hepatomegaly. No rebound/guarding. No obvious abdominal masses. Msk:  Strength and tone appear normal for age. There is a protrusion along her left buttocks that is firm and seems most consistent with bone. Non-mobile. No ecchymosis noted. ROM in tact.  Extremities: No clubbing or cyanosis. Trace lower extremity edema.  Distal pedal pulses are 2+ bilaterally. Neuro: Alert and oriented X 3. Moves all extremities spontaneously. No focal deficits noted. Psych:  Responds to questions appropriately with a normal affect. Skin: No rashes or lesions noted  Wt Readings from Last 3 Encounters:  08/18/16  137 lb 3.2 oz (62.2 kg)  03/27/16 143 lb 12.8 oz (65.2 kg)  08/24/15 145 lb (65.8 kg)     Studies/Labs Reviewed:   EKG:  EKG is not ordered today.    Recent Labs: 08/24/2015: ALT 11; BUN 21; Creat 0.96; Hemoglobin 13.3; Platelets 171; Potassium 4.6; Sodium 142; TSH 4.84   Lipid Panel    Component Value Date/Time   CHOL 158 08/24/2015 1149   TRIG 174 (H) 08/24/2015 1149   HDL 52 08/24/2015 1149   CHOLHDL 3.0 08/24/2015 1149   VLDL 35 (H) 08/24/2015 1149   LDLCALC 71 08/24/2015 1149    Additional studies/ records that were reviewed today include:   Echocardiogram: 08/2011   Assessment:    1. Dizziness   2. Pain of left hip joint   3. CHB (complete heart block) (HCC)   4. Chronic diastolic heart failure (HCC)      Plan:   In order of problems listed above:  1. Dizziness - She experienced an episode of dizziness occurring 2 weeks ago following a vomiting spell. She denies any associated palpitations, chest pain, headaches, dyspnea, or presyncope. Her symptoms spontaneously resolved with no repeat episodes since. - her symptoms do not appear to be of a  cardiac etiology and are likely secondary to her acute illness at that time.  - device interrogation shows no significant events at that time.    2. Left Hip Pain  - the patient experienced a mechanical fall two months ago and landed on her left hip. She has noticed swelling and protrusion along the site and a visible, non-mobile deformity is noted along her left buttocks today. ROM and strength are intact so it is very unlikely she broke her hip at that time.  - she does not have follow-up with her PCP until August and does not wish to come back to [place] sooner than this due to living an hour away.  - will obtain plain films of her left hip today. If significant abnormality is noted, she will need a referral to Orthopedics.  - she is still having pain at the site, mostly at night. Recommended using ice along with Tylenol as needed for her symptoms. If no improvement within the next few weeks, she will need to follow-up with her PCP.   3. Complete Heart Block - s/p PPM placement in 1991 with generator changes since. Most recent device interrogation in 03/2016 showed < 1% AT/AF burden with no significant abnormalities.  - she thought her device interrogation with Dr. Salena Saner was scheduled for today. With her recent episode of dizziness and per the patient's request (as she does not wish to drive an hour for another appointment next week) her device is interrogated today. This shows < 1% AT/AF detection with a remaining longevity of 1.25 - 1.75 years.   4. Chronic Diastolic CHF - echo in 2013 showed a preserved EF of > 55% at that time.  - she does not appear volume overloaded by physical examination. - sodium and fluid restriction reviewed with the patient along with elevating her legs throughout the day. She has PRN Lasix to take as needed for edema but takes this sparingly due to increased diuresis.    Medication Adjustments/Labs and Tests Ordered: Current medicines are reviewed at length with  the patient today.  Concerns regarding medicines are outlined above.  Medication changes, Labs and Tests ordered today are listed in the Patient Instructions below. Patient Instructions  Medication Instructions: No changes  Procedures/Testing:  An hip x-ray has been ordered for you today. This [facility] be done at [facility] at [address]  Follow-Up: Your physician wants you to follow-up in: 6 months with Dr. Royann Shiversroitoru. You will receive a reminder letter in the mail two months in advance. If you don't receive a letter, please call our office to schedule the follow-up appointment.  If you need a refill on your cardiac medications before your next appointment, please call your pharmacy.    Signed, Ellsworth LennoxBrittany M Decker Cogdell, PA-C  08/18/2016 10:03 AM    [facility] [address], [address] [place], [place]  [address] Phone: [phone]; Fax: [phone]  [address], [address] [place], [place] [address] Phone: [phone]

## 2016-08-18 ENCOUNTER — Ambulatory Visit (INDEPENDENT_AMBULATORY_CARE_PROVIDER_SITE_OTHER): Payer: Medicare HMO | Admitting: Student

## 2016-08-18 ENCOUNTER — Ambulatory Visit
Admission: RE | Admit: 2016-08-18 | Discharge: 2016-08-18 | Disposition: A | Discharge status: Home / Self Care | Payer: Medicare HMO | Source: Ambulatory Visit | Attending: Student | Admitting: Student

## 2016-08-18 ENCOUNTER — Encounter: Payer: Self-pay | Admitting: Student

## 2016-08-18 VITALS — BP 130/82 | HR 67 | Ht 63.0 in | Wt 137.2 lb

## 2016-08-18 DIAGNOSIS — I442 Atrioventricular block, complete: Secondary | ICD-10-CM | POA: Diagnosis not present

## 2016-08-18 DIAGNOSIS — I5032 Chronic diastolic (congestive) heart failure: Secondary | ICD-10-CM

## 2016-08-18 DIAGNOSIS — R42 Dizziness and giddiness: Secondary | ICD-10-CM

## 2016-08-18 DIAGNOSIS — M25552 Pain in left hip: Secondary | ICD-10-CM

## 2016-08-18 DIAGNOSIS — S79912A Unspecified injury of left hip, initial encounter: Secondary | ICD-10-CM | POA: Diagnosis not present

## 2016-08-18 MED ORDER — VERAPAMIL HCL ER 240 MG PO TBCR: 240.0000 mg | EXTENDED_RELEASE_TABLET | Freq: Every day | ORAL | 3 refills | Status: DC

## 2016-08-18 MED ORDER — LEVOTHYROXINE SODIUM 50 MCG PO TABS: 50.0000 ug | ORAL_TABLET | Freq: Every day | ORAL | 3 refills | Status: AC

## 2016-08-18 MED ORDER — PANTOPRAZOLE SODIUM 40 MG PO TBEC: 40.0000 mg | DELAYED_RELEASE_TABLET | Freq: Every day | ORAL | 1 refills | Status: DC

## 2016-08-18 MED ORDER — PRAVASTATIN SODIUM 40 MG PO TABS: 40.0000 mg | ORAL_TABLET | Freq: Every day | ORAL | 3 refills | Status: DC

## 2016-08-18 NOTE — Patient Instructions (Addendum)
Medication Instructions: No changes   Procedures/Testing: An hip x-ray has been ordered for you today. This [facility] be done at [facility] at [address]  Follow-Up: Your physician wants you to follow-up in: 6 months with Dr. Royann Shiversroitoru. You will receive a reminder letter in the mail two months in advance. If you don't receive a letter, please call our office to schedule the follow-up appointment.   If you need a refill on your cardiac medications before your next appointment, please call your pharmacy.

## 2016-08-18 NOTE — Progress Notes (Signed)
Full service! Mcr

## 2016-10-16 DIAGNOSIS — I10 Essential (primary) hypertension: Secondary | ICD-10-CM | POA: Diagnosis not present

## 2016-10-16 DIAGNOSIS — E559 Vitamin D deficiency, unspecified: Secondary | ICD-10-CM | POA: Diagnosis not present

## 2016-10-16 DIAGNOSIS — E7800 Pure hypercholesterolemia, unspecified: Secondary | ICD-10-CM | POA: Diagnosis not present

## 2016-10-16 DIAGNOSIS — E038 Other specified hypothyroidism: Secondary | ICD-10-CM | POA: Diagnosis not present

## 2016-10-23 DIAGNOSIS — Z0000 Encounter for general adult medical examination without abnormal findings: Secondary | ICD-10-CM | POA: Diagnosis not present

## 2016-10-23 DIAGNOSIS — Z950 Presence of cardiac pacemaker: Secondary | ICD-10-CM | POA: Diagnosis not present

## 2016-10-23 DIAGNOSIS — N318 Other neuromuscular dysfunction of bladder: Secondary | ICD-10-CM | POA: Diagnosis not present

## 2016-10-23 DIAGNOSIS — I422 Other hypertrophic cardiomyopathy: Secondary | ICD-10-CM | POA: Diagnosis not present

## 2016-10-23 DIAGNOSIS — I130 Hypertensive heart and chronic kidney disease with heart failure and stage 1 through stage 4 chronic kidney disease, or unspecified chronic kidney disease: Secondary | ICD-10-CM | POA: Diagnosis not present

## 2016-10-23 DIAGNOSIS — E038 Other specified hypothyroidism: Secondary | ICD-10-CM | POA: Diagnosis not present

## 2016-10-23 DIAGNOSIS — E559 Vitamin D deficiency, unspecified: Secondary | ICD-10-CM | POA: Diagnosis not present

## 2016-10-23 DIAGNOSIS — N183 Chronic kidney disease, stage 3 (moderate): Secondary | ICD-10-CM | POA: Diagnosis not present

## 2016-10-23 DIAGNOSIS — M810 Age-related osteoporosis without current pathological fracture: Secondary | ICD-10-CM | POA: Diagnosis not present

## 2016-11-27 ENCOUNTER — Other Ambulatory Visit: Payer: Self-pay | Admitting: Cardiovascular Disease

## 2016-11-27 MED ORDER — LOSARTAN POTASSIUM 50 MG PO TABS: 50.0000 mg | ORAL_TABLET | Freq: Every day | ORAL | 3 refills | Status: DC

## 2016-11-27 NOTE — Telephone Encounter (Signed)
Rx(s) sent to pharmacy electronically.  

## 2016-12-01 ENCOUNTER — Other Ambulatory Visit: Payer: Self-pay

## 2016-12-01 MED ORDER — PRAVASTATIN SODIUM 40 MG PO TABS: 40.0000 mg | ORAL_TABLET | Freq: Every day | ORAL | 0 refills | Status: DC

## 2016-12-05 ENCOUNTER — Telehealth: Payer: Self-pay | Admitting: Cardiovascular Disease

## 2016-12-05 MED ORDER — PRAVASTATIN SODIUM 40 MG PO TABS: 40.0000 mg | ORAL_TABLET | Freq: Every day | ORAL | 3 refills | Status: DC

## 2016-12-05 NOTE — Telephone Encounter (Signed)
New message ° ° ° °Pt is returning call to nurse.  °

## 2016-12-05 NOTE — Telephone Encounter (Signed)
Patient calling in to state that she needs a refill on pravastatin. The order has been sent to [facility] for her.

## 2017-02-13 ENCOUNTER — Other Ambulatory Visit: Payer: Self-pay | Admitting: Student

## 2017-02-13 NOTE — Telephone Encounter (Signed)
REFILL 

## 2017-03-11 DIAGNOSIS — G459 Transient cerebral ischemic attack, unspecified: Secondary | ICD-10-CM | POA: Diagnosis not present

## 2017-03-11 DIAGNOSIS — I495 Sick sinus syndrome: Secondary | ICD-10-CM | POA: Diagnosis not present

## 2017-03-11 DIAGNOSIS — R0989 Other specified symptoms and signs involving the circulatory and respiratory systems: Secondary | ICD-10-CM | POA: Insufficient documentation

## 2017-03-11 DIAGNOSIS — N3000 Acute cystitis without hematuria: Secondary | ICD-10-CM | POA: Diagnosis not present

## 2017-03-11 DIAGNOSIS — Z79899 Other long term (current) drug therapy: Secondary | ICD-10-CM | POA: Diagnosis not present

## 2017-03-11 DIAGNOSIS — E785 Hyperlipidemia, unspecified: Secondary | ICD-10-CM | POA: Diagnosis not present

## 2017-03-11 DIAGNOSIS — R5381 Other malaise: Secondary | ICD-10-CM | POA: Diagnosis not present

## 2017-03-11 DIAGNOSIS — Z7982 Long term (current) use of aspirin: Secondary | ICD-10-CM | POA: Diagnosis not present

## 2017-03-11 DIAGNOSIS — I6781 Acute cerebrovascular insufficiency: Secondary | ICD-10-CM | POA: Diagnosis not present

## 2017-03-11 DIAGNOSIS — R531 Weakness: Secondary | ICD-10-CM | POA: Diagnosis not present

## 2017-03-11 DIAGNOSIS — E869 Volume depletion, unspecified: Secondary | ICD-10-CM | POA: Insufficient documentation

## 2017-03-11 DIAGNOSIS — N179 Acute kidney failure, unspecified: Secondary | ICD-10-CM | POA: Insufficient documentation

## 2017-03-12 DIAGNOSIS — G459 Transient cerebral ischemic attack, unspecified: Secondary | ICD-10-CM | POA: Diagnosis not present

## 2017-03-12 DIAGNOSIS — I083 Combined rheumatic disorders of mitral, aortic and tricuspid valves: Secondary | ICD-10-CM | POA: Diagnosis not present

## 2017-03-12 DIAGNOSIS — I517 Cardiomegaly: Secondary | ICD-10-CM | POA: Diagnosis not present

## 2017-03-12 DIAGNOSIS — Z950 Presence of cardiac pacemaker: Secondary | ICD-10-CM | POA: Diagnosis not present

## 2017-03-12 DIAGNOSIS — N3000 Acute cystitis without hematuria: Secondary | ICD-10-CM | POA: Insufficient documentation

## 2017-03-22 DIAGNOSIS — N183 Chronic kidney disease, stage 3 (moderate): Secondary | ICD-10-CM | POA: Diagnosis not present

## 2017-03-22 DIAGNOSIS — E7800 Pure hypercholesterolemia, unspecified: Secondary | ICD-10-CM | POA: Diagnosis not present

## 2017-03-22 DIAGNOSIS — I130 Hypertensive heart and chronic kidney disease with heart failure and stage 1 through stage 4 chronic kidney disease, or unspecified chronic kidney disease: Secondary | ICD-10-CM | POA: Diagnosis not present

## 2017-03-29 ENCOUNTER — Telehealth: Payer: Self-pay | Admitting: Cardiovascular Disease

## 2017-03-29 NOTE — Telephone Encounter (Signed)
Pt c/o medication issue:  1. Name of Medication: losartan (COZAAR) 50 MG tablet  2. How are you currently taking this medication (dosage and times per day)?Take 1 tablet (50 mg total) by mouth daily.  3. Are you having a reaction (difficulty breathing--STAT)? no 4. What is your medication issue? Pt want rn to call her because its a recall on the medication

## 2017-03-30 NOTE — Telephone Encounter (Signed)
Talked to Mr Abigail Melendez (son). Patient received new supply of losartan. NO additional questions today.

## 2017-04-10 DIAGNOSIS — I130 Hypertensive heart and chronic kidney disease with heart failure and stage 1 through stage 4 chronic kidney disease, or unspecified chronic kidney disease: Secondary | ICD-10-CM | POA: Diagnosis not present

## 2017-04-10 DIAGNOSIS — N390 Urinary tract infection, site not specified: Secondary | ICD-10-CM | POA: Diagnosis not present

## 2017-04-10 DIAGNOSIS — G458 Other transient cerebral ischemic attacks and related syndromes: Secondary | ICD-10-CM | POA: Diagnosis not present

## 2017-04-10 DIAGNOSIS — Z6825 Body mass index (BMI) 25.0-25.9, adult: Secondary | ICD-10-CM | POA: Diagnosis not present

## 2017-04-10 DIAGNOSIS — N183 Chronic kidney disease, stage 3 (moderate): Secondary | ICD-10-CM | POA: Diagnosis not present

## 2017-04-10 DIAGNOSIS — Z950 Presence of cardiac pacemaker: Secondary | ICD-10-CM | POA: Diagnosis not present

## 2017-04-11 ENCOUNTER — Encounter: Payer: Self-pay | Admitting: Cardiology

## 2017-04-11 ENCOUNTER — Ambulatory Visit: Payer: Medicare HMO | Admitting: Neurology

## 2017-04-11 ENCOUNTER — Ambulatory Visit (HOSPITAL_COMMUNITY)
Admission: RE | Disposition: A | Discharge status: Home / Self Care | Payer: Self-pay | Source: Ambulatory Visit | Attending: Cardiovascular Disease

## 2017-04-11 ENCOUNTER — Ambulatory Visit: Payer: Medicare HMO | Admitting: Cardiology

## 2017-04-11 ENCOUNTER — Ambulatory Visit (HOSPITAL_COMMUNITY)
Admission: RE | Admit: 2017-04-11 | Discharge: 2017-04-11 | Disposition: A | Discharge status: Home / Self Care | Payer: Medicare HMO | Source: Ambulatory Visit | Attending: Cardiovascular Disease | Admitting: Cardiovascular Disease

## 2017-04-11 VITALS — BP 126/60 | HR 70 | Wt 136.0 lb

## 2017-04-11 DIAGNOSIS — I10 Essential (primary) hypertension: Secondary | ICD-10-CM

## 2017-04-11 DIAGNOSIS — I442 Atrioventricular block, complete: Secondary | ICD-10-CM | POA: Diagnosis not present

## 2017-04-11 DIAGNOSIS — E039 Hypothyroidism, unspecified: Secondary | ICD-10-CM | POA: Diagnosis not present

## 2017-04-11 DIAGNOSIS — Z0389 Encounter for observation for other suspected diseases and conditions ruled out: Secondary | ICD-10-CM | POA: Diagnosis not present

## 2017-04-11 DIAGNOSIS — I110 Hypertensive heart disease with heart failure: Secondary | ICD-10-CM | POA: Diagnosis not present

## 2017-04-11 DIAGNOSIS — Z45010 Encounter for checking and testing of cardiac pacemaker pulse generator [battery]: Secondary | ICD-10-CM | POA: Insufficient documentation

## 2017-04-11 DIAGNOSIS — Z87891 Personal history of nicotine dependence: Secondary | ICD-10-CM | POA: Diagnosis not present

## 2017-04-11 DIAGNOSIS — Z950 Presence of cardiac pacemaker: Secondary | ICD-10-CM | POA: Diagnosis not present

## 2017-04-11 DIAGNOSIS — E785 Hyperlipidemia, unspecified: Secondary | ICD-10-CM | POA: Diagnosis not present

## 2017-04-11 DIAGNOSIS — Z79899 Other long term (current) drug therapy: Secondary | ICD-10-CM | POA: Insufficient documentation

## 2017-04-11 DIAGNOSIS — IMO0001 Reserved for inherently not codable concepts without codable children: Secondary | ICD-10-CM

## 2017-04-11 DIAGNOSIS — Z7982 Long term (current) use of aspirin: Secondary | ICD-10-CM | POA: Insufficient documentation

## 2017-04-11 DIAGNOSIS — I5032 Chronic diastolic (congestive) heart failure: Secondary | ICD-10-CM | POA: Insufficient documentation

## 2017-04-11 DIAGNOSIS — Z45018 Encounter for adjustment and management of other part of cardiac pacemaker: Secondary | ICD-10-CM

## 2017-04-11 HISTORY — PX: PPM GENERATOR CHANGEOUT: EP1233

## 2017-04-11 LAB — BASIC METABOLIC PANEL
ANION GAP: 10 (ref 5–15)
BUN: 19 mg/dL (ref 6–20)
CO2: 26 mmol/L (ref 22–32)
Calcium: 9.1 mg/dL (ref 8.9–10.3)
Chloride: 104 mmol/L (ref 101–111)
Creatinine, Ser: 1.18 mg/dL — ABNORMAL HIGH (ref 0.44–1.00)
GFR, EST AFRICAN AMERICAN: 44 mL/min — AB (ref >60)
GFR, EST NON AFRICAN AMERICAN: 38 mL/min — AB (ref >60)
GLUCOSE: 88 mg/dL (ref 65–99)
POTASSIUM: 4.2 mmol/L (ref 3.5–5.1)
Sodium: 140 mmol/L (ref 135–145)

## 2017-04-11 LAB — PROTIME-INR
INR: 1.07
PROTHROMBIN TIME: 13.9 s (ref 11.4–15.2)

## 2017-04-11 LAB — SURGICAL PCR SCREEN
MRSA, PCR: NEGATIVE
Staphylococcus aureus: NEGATIVE

## 2017-04-11 LAB — CBC
HEMATOCRIT: 34.7 % — AB (ref 36.0–46.0)
HEMOGLOBIN: 11.7 g/dL — AB (ref 12.0–15.0)
MCH: 30.2 pg (ref 26.0–34.0)
MCHC: 33.7 g/dL (ref 30.0–36.0)
MCV: 89.7 fL (ref 78.0–100.0)
Platelets: 150 10*3/uL (ref 150–400)
RBC: 3.87 MIL/uL (ref 3.87–5.11)
RDW: 13.1 % (ref 11.5–15.5)
WBC: 6.4 10*3/uL (ref 4.0–10.5)

## 2017-04-11 SURGERY — PPM GENERATOR CHANGEOUT

## 2017-04-11 MED ORDER — SODIUM CHLORIDE 0.9 % IV SOLN
INTRAVENOUS | Status: DC
  Administered 2017-04-11: 13:00:00 via INTRAVENOUS

## 2017-04-11 MED ORDER — LIDOCAINE HCL (PF) 1 % IJ SOLN
INTRAMUSCULAR | Status: AC
  Filled 2017-04-11: qty 60

## 2017-04-11 MED ORDER — MUPIROCIN 2 % EX OINT
TOPICAL_OINTMENT | CUTANEOUS | Status: AC
  Filled 2017-04-11: qty 22

## 2017-04-11 MED ORDER — CEFAZOLIN SODIUM-DEXTROSE 2-4 GM/100ML-% IV SOLN
2.0000 g | INTRAVENOUS | Status: AC
  Administered 2017-04-11: 2 g via INTRAVENOUS

## 2017-04-11 MED ORDER — SODIUM CHLORIDE 0.9 % IR SOLN
Status: AC
  Filled 2017-04-11: qty 2

## 2017-04-11 MED ORDER — SODIUM CHLORIDE 0.9 % IR SOLN
80.0000 mg | Status: AC
  Administered 2017-04-11: 80 mg

## 2017-04-11 MED ORDER — LIDOCAINE HCL (PF) 1 % IJ SOLN
INTRAMUSCULAR | Status: DC | PRN
  Administered 2017-04-11: 60 mL

## 2017-04-11 MED ORDER — CEFAZOLIN SODIUM-DEXTROSE 2-4 GM/100ML-% IV SOLN
INTRAVENOUS | Status: AC
  Filled 2017-04-11: qty 100

## 2017-04-11 MED ORDER — ACETAMINOPHEN 325 MG PO TABS: 325.0000 mg | ORAL_TABLET | ORAL | Status: DC | PRN

## 2017-04-11 SURGICAL SUPPLY — 4 items
CABLE SURGICAL S-101-97-12 (CABLE) ×2 IMPLANT
PACEMAKER ASSURITY DR-RF (Pacemaker) ×2 IMPLANT
PAD DEFIB LIFELINK (PAD) ×2 IMPLANT
TRAY PACEMAKER INSERTION (PACKS) ×2 IMPLANT

## 2017-04-11 NOTE — Discharge Instructions (Signed)

## 2017-04-11 NOTE — Assessment & Plan Note (Signed)
Cath 1986

## 2017-04-11 NOTE — Assessment & Plan Note (Signed)
Check today indicates close to EOL. Admit for generator change

## 2017-04-11 NOTE — Patient Instructions (Signed)
YOU NEED TO GO TO [facility] CHECK IN AT ADMISSIONS OFFICE AN THEY WILL TAKE TO YOU TO SHORT STAY. THEY ARE WAITING FOR YOUR ARRIVAL.  Medication Instructions:  Your physician recommends that you continue on your current medications as directed. Please refer to the Current Medication list given to you today.  Labwork: None   Testing/Procedures: None   Follow-Up: An appointment will be made upon departure of the hospital  Any Other Special Instructions Will Be Listed Below (If Applicable).    If you need a refill on your cardiac medications before your next appointment, please call your pharmacy.

## 2017-04-11 NOTE — Interval H&P Note (Signed)
History and Physical Interval Note:  04/11/2017 1:11 PM  Abigail Melendez  has presented today for surgery, with the diagnosis of ERI  The various methods of treatment have been discussed with the patient and family. After consideration of risks, benefits and other options for treatment, the patient has consented to  Procedure(s): PPM GENERATOR CHANGEOUT (N/A) as a surgical intervention .  The patient's history has been reviewed, patient examined, no change in status, stable for surgery.  I have reviewed the patient's chart and labs.  Questions were answered to the patient's satisfaction.     Kourtlyn Charlet

## 2017-04-11 NOTE — Op Note (Signed)
Procedure report  Procedure performed:  1. Dual chamber pacemaker generator changeout   Reason for procedure:  1. Device generator at elective replacement interval  2. Complete heart block Procedure performed by:  Thurmon FairMihai Axten Pascucci, MD  Complications:  None  Estimated blood loss:  <5 mL  Medications administered during procedure:  Ancef 2 g intravenously, lidocaine 1% 30 mL locally  Device details:   New Generator St.Jude Assurity MRI model number 2272, serial number [id number] Right atrial lead (chronic) St. Jude, model number 1022T, serial [id number] (implanted 12/10/1989) Right ventricular lead (chronic)  St. jude, model number 1216T, serial number [id number] (implanted 12/10/1989)  Explanted generator St. Jude Victory XL DR,  model number [id number] (implanted 02/13/2006)  Procedure details:  After the risks and benefits of the procedure were discussed the patient provided informed consent. She was brought to the cardiac catheter lab in the fasting state. The patient was prepped and draped in usual sterile fashion. Local anesthesia with 1% lidocaine was administered to to the left infraclavicular area. A 5-6cm horizontal incision was made parallel with and 2-3 cm caudal to the right clavicle, in the area of 3 older scars. . Using sharp and (mostly) blunt dissection the prepectoral pocket was opened carefully to avoid injury to the loops of chronic leads. Extensive dissection was not necessary. The device was explanted. The pocket was carefully inspected for hemostasis and flushed with copious amounts of antibiotic solution.  The leads were disconnected from the old generator and testing of the lead parameters via telemetry showed excellent values. The new generator was connected to the chronic leads, with appropriate pacing noted.   The entire system was then carefully inserted in the pocket with care been taking that the leads and device assumed a comfortable position without pressure on the  incision. Great care was taken that the leads be located deep to the generator. The pocket was then closed in layers using 2 layers of 2-0 Vicryl and cutaneous staples after which a sterile dressing was applied.   At the end of the procedure the following lead parameters were encountered:   Right atrial lead sensed P waves 2.1 mV, impedance 240 ohms, threshold 1.0 at 0.5 ms pulse width (unipolar).  Right ventricular lead sensed R waves  Not detected, impedance 380 ohms, threshold 1.0 at 0.5 ms pulse width (unipolar).  For both leads, unipolar pacing threshold was superior to bipolar pacing threshold.

## 2017-04-11 NOTE — Assessment & Plan Note (Signed)
Controlled.  

## 2017-04-11 NOTE — Progress Notes (Signed)
I have seen and examined the patient along with Corine Shelterluke Kilroy, PA.  I have reviewed the chart, notes and new data.  I agree with PA's note.  Key new complaints: fatigue for many weeks Key examination changes: paradoxically split S2, otherwise normal CV exam  Key new findings / data: PPM at ERI since 01/15/2017. Otherwise normal lead function. No underlying ventricular activity, CHB, pacemaker dependent.  PLAN: Pacemaker generator changeout today. This procedure has been fully reviewed with the patient and her son and written informed consent has been obtained.   Thurmon FairMihai Channin Agustin, MD, [facility] [phone] 04/11/2017, 11:11 AM

## 2017-04-11 NOTE — H&P (View-Only) (Signed)
04/11/2017 Abigail Melendez   [birth year]  [id number]  Primary Physician Tisovec, Adelfa Koh, MD Primary Cardiologist: Dr Royann Shivers  HPI:  84 y.o. female with past medical history of CHB (s/p PPM placement in 1991 with subsequent generator changes, last one 2007), chronic diastolic CHF, HTN, and HLD who presents to the office today for 69-month follow-up.    She was last examined by in June 2018 and reported doing well from a cardiac perspective at that time. Her device was interrogated and showed < 1% AT/AF burden with no changes made at that time. She is nearing EOL. She is in the office today for follow up. Device check shows her pacer is ready to be changed and she'll be set up for an OP generator change.   She was seen in [place] around Christmas for weakness. She was worked for possible CVA. CT was negative, CA dopplers negative. An echo was done which revealed an EF of 50-55%. She did apparently have a UTI and this was treated. She has done well since. She has an appointment for neurological evaluation scheduled with Dr Roda Shutters.    Current Outpatient Medications  Medication Sig Dispense Refill  . aspirin 81 MG tablet Take 81 mg by mouth daily.    Marland Kitchen levothyroxine (SYNTHROID, LEVOTHROID) 50 MCG tablet Take 1 tablet (50 mcg total) by mouth daily. 90 tablet 3  . losartan (COZAAR) 50 MG tablet Take 1 tablet (50 mg total) by mouth daily. 90 tablet 3  . Multiple Vitamin (MULTIVITAMIN) tablet Take 1 tablet by mouth daily.    . pantoprazole (PROTONIX) 40 MG tablet TAKE 1 TABLET (40 MG TOTAL) BY MOUTH DAILY. 90 tablet 1  . pravastatin (PRAVACHOL) 40 MG tablet Take 1 tablet (40 mg total) by mouth daily. 90 tablet 3  . verapamil (CALAN-SR) 240 MG CR tablet Take 1 tablet (240 mg total) by mouth at bedtime. 90 tablet 3  . furosemide (LASIX) 40 MG tablet Take 40 mg by mouth daily as needed.     No current facility-administered medications for this visit.     Allergies  Allergen Reactions  .  Codeine     Past Medical History:  Diagnosis Date  . Anxiety   . CHB (complete heart block) (HCC) 01/22/2013   Pacemaker dependent   . CHF (congestive heart failure) (HCC)    a. echo in 2013 showed a preserved EF of greater than 55% with no significant valve abnormalities.   . Complete heart block [facility]) May 18, 1998   pacemaker implanted  . Edema leg 10-12-1997 lower extremity venous study   pain and edema x 1 week . no dvt,large non-vascular mass located at the level of the lt. medial knee and extends to the proximal calf  . Hyperlipidemia   . Hypertension   . Hypertonicity of bladder   . Hypothyroidism   . Murmur 09-05-2011 adult echocardiogram   EF 55% mild abnormalities; no change in therapy   . Osteoporosis   . Pacemaker 05/18/1998   St.Jude affinityDR modle#5330 serial#[id number]  . Sick sinus syndrome (HCC)    a. normal cardiolite myocardial perfusion study in 2007.   Marland Kitchen Syncope, near 05-13-2003 corotid doppler   mild heterogeneous plaque noted at origins of ica andeca no evidence of significant ica stenosis . vertebral artery flow is antegrade    Social History   Socioeconomic History  . Marital status: Widowed    Spouse name: Not on file  . Number of children: Not on file  .  Years of education: Not on file  . Highest education level: Not on file  Social Needs  . Financial resource strain: Not on file  . Food insecurity - worry: Not on file  . Food insecurity - inability: Not on file  . Transportation needs - medical: Not on file  . Transportation needs - non-medical: Not on file  Occupational History  . Not on file  Tobacco Use  . Smoking status: Former Games developermoker  . Smokeless tobacco: Never Used  Substance and Sexual Activity  . Alcohol use: Not on file  . Drug use: Not on file  . Sexual activity: Not on file  Other Topics Concern  . Not on file  Social History Narrative  . Not on file     No FM Hx of CAD, DM, or HTN  Review of Systems: General: negative  for chills, fever, night sweats or weight changes.  Cardiovascular: negative for chest pain, dyspnea on exertion, edema, orthopnea, palpitations, paroxysmal nocturnal dyspnea or shortness of breath Dermatological: negative for rash Respiratory: negative for cough or wheezing Urologic: negative for hematuria Abdominal: negative for nausea, vomiting, diarrhea, bright red blood per rectum, melena, or hematemesis Neurologic: negative for visual changes, syncope, or dizziness All other systems reviewed and are otherwise negative except as noted above.    Blood pressure 126/60, pulse 70, weight 136 lb (61.7 kg).  General appearance: alert, cooperative, appears stated age and no distress Neck: no carotid bruit and no JVD Lungs: clear to auscultation bilaterally Heart: regular rate and rhythm and 2/6 systolic murmur AOV Abdomen: soft, non-tender; bowel sounds normal; no masses,  no organomegaly and RUQ surgical scar Extremities: extremities normal, atraumatic, no cyanosis or edema Pulses: 2+ and symmetric Skin: Skin color, texture, turgor normal. No rashes or lesions Neurologic: Grossly normal  EKG V paced  ASSESSMENT AND PLAN:   Pacemaker end of life Check today indicates close to EOL. Admit for generator change  Pacemaker - dual chamber St Jude, implanted 1991, generator change 2007 Pacemaker dependent, atrial lead 1022 and ventricular lead 1216 implant in September 1991; 2 subsequent generator changes, last in 2007 St. Jude victory xl dr No underlying ventricular rhythm  Normal coronary arteries Cath 1988  HTN (hypertension) Controlled   PLAN  Pt seen by Dr Royann Shiversroitoru and myself- admit for generator change.   Corine ShelterLuke Kynleigh Artz PA-C 04/11/2017 11:17 AM

## 2017-04-11 NOTE — Assessment & Plan Note (Signed)
Pacemaker dependent, atrial lead 1022 and ventricular lead 1216 implant in September 1991; 2 subsequent generator changes, last in 2007 St. Jude victory xl dr No underlying ventricular rhythm

## 2017-04-11 NOTE — Progress Notes (Signed)
04/11/2017 Abigail Melendez   [birth year]  [id number]  Primary Physician Tisovec, Adelfa Koh, MD Primary Cardiologist: Dr Royann Shivers  HPI:  84 y.o. female with past medical history of CHB (s/p PPM placement in 1991 with subsequent generator changes, last one 2007), chronic diastolic CHF, HTN, and HLD who presents to the office today for 69-month follow-up.    She was last examined by in June 2018 and reported doing well from a cardiac perspective at that time. Her device was interrogated and showed < 1% AT/AF burden with no changes made at that time. She is nearing EOL. She is in the office today for follow up. Device check shows her pacer is ready to be changed and she'll be set up for an OP generator change.   She was seen in [place] around Christmas for weakness. She was worked for possible CVA. CT was negative, CA dopplers negative. An echo was done which revealed an EF of 50-55%. She did apparently have a UTI and this was treated. She has done well since. She has an appointment for neurological evaluation scheduled with Dr Roda Shutters.    Current Outpatient Medications  Medication Sig Dispense Refill  . aspirin 81 MG tablet Take 81 mg by mouth daily.    Marland Kitchen levothyroxine (SYNTHROID, LEVOTHROID) 50 MCG tablet Take 1 tablet (50 mcg total) by mouth daily. 90 tablet 3  . losartan (COZAAR) 50 MG tablet Take 1 tablet (50 mg total) by mouth daily. 90 tablet 3  . Multiple Vitamin (MULTIVITAMIN) tablet Take 1 tablet by mouth daily.    . pantoprazole (PROTONIX) 40 MG tablet TAKE 1 TABLET (40 MG TOTAL) BY MOUTH DAILY. 90 tablet 1  . pravastatin (PRAVACHOL) 40 MG tablet Take 1 tablet (40 mg total) by mouth daily. 90 tablet 3  . verapamil (CALAN-SR) 240 MG CR tablet Take 1 tablet (240 mg total) by mouth at bedtime. 90 tablet 3  . furosemide (LASIX) 40 MG tablet Take 40 mg by mouth daily as needed.     No current facility-administered medications for this visit.     Allergies  Allergen Reactions  .  Codeine     Past Medical History:  Diagnosis Date  . Anxiety   . CHB (complete heart block) (HCC) 01/22/2013   Pacemaker dependent   . CHF (congestive heart failure) (HCC)    a. echo in 2013 showed a preserved EF of greater than 55% with no significant valve abnormalities.   . Complete heart block [facility]) May 18, 1998   pacemaker implanted  . Edema leg 10-12-1997 lower extremity venous study   pain and edema x 1 week . no dvt,large non-vascular mass located at the level of the lt. medial knee and extends to the proximal calf  . Hyperlipidemia   . Hypertension   . Hypertonicity of bladder   . Hypothyroidism   . Murmur 09-05-2011 adult echocardiogram   EF 55% mild abnormalities; no change in therapy   . Osteoporosis   . Pacemaker 05/18/1998   St.Jude affinityDR modle#5330 serial#[id number]  . Sick sinus syndrome (HCC)    a. normal cardiolite myocardial perfusion study in 2007.   Marland Kitchen Syncope, near 05-13-2003 corotid doppler   mild heterogeneous plaque noted at origins of ica andeca no evidence of significant ica stenosis . vertebral artery flow is antegrade    Social History   Socioeconomic History  . Marital status: Widowed    Spouse name: Not on file  . Number of children: Not on file  .  Years of education: Not on file  . Highest education level: Not on file  Social Needs  . Financial resource strain: Not on file  . Food insecurity - worry: Not on file  . Food insecurity - inability: Not on file  . Transportation needs - medical: Not on file  . Transportation needs - non-medical: Not on file  Occupational History  . Not on file  Tobacco Use  . Smoking status: Former Games developermoker  . Smokeless tobacco: Never Used  Substance and Sexual Activity  . Alcohol use: Not on file  . Drug use: Not on file  . Sexual activity: Not on file  Other Topics Concern  . Not on file  Social History Narrative  . Not on file     No FM Hx of CAD, DM, or HTN  Review of Systems: General: negative  for chills, fever, night sweats or weight changes.  Cardiovascular: negative for chest pain, dyspnea on exertion, edema, orthopnea, palpitations, paroxysmal nocturnal dyspnea or shortness of breath Dermatological: negative for rash Respiratory: negative for cough or wheezing Urologic: negative for hematuria Abdominal: negative for nausea, vomiting, diarrhea, bright red blood per rectum, melena, or hematemesis Neurologic: negative for visual changes, syncope, or dizziness All other systems reviewed and are otherwise negative except as noted above.    Blood pressure 126/60, pulse 70, weight 136 lb (61.7 kg).  General appearance: alert, cooperative, appears stated age and no distress Neck: no carotid bruit and no JVD Lungs: clear to auscultation bilaterally Heart: regular rate and rhythm and 2/6 systolic murmur AOV Abdomen: soft, non-tender; bowel sounds normal; no masses,  no organomegaly and RUQ surgical scar Extremities: extremities normal, atraumatic, no cyanosis or edema Pulses: 2+ and symmetric Skin: Skin color, texture, turgor normal. No rashes or lesions Neurologic: Grossly normal  EKG V paced  ASSESSMENT AND PLAN:   Pacemaker end of life Check today indicates close to EOL. Admit for generator change  Pacemaker - dual chamber St Jude, implanted 1991, generator change 2007 Pacemaker dependent, atrial lead 1022 and ventricular lead 1216 implant in September 1991; 2 subsequent generator changes, last in 2007 St. Jude victory xl dr No underlying ventricular rhythm  Normal coronary arteries Cath 1988  HTN (hypertension) Controlled   PLAN  Pt seen by Dr Royann Shiversroitoru and myself- admit for generator change.   Corine ShelterLuke Mouhamadou Gittleman PA-C 04/11/2017 11:17 AM

## 2017-04-12 ENCOUNTER — Encounter (HOSPITAL_COMMUNITY): Payer: Self-pay | Admitting: Cardiovascular Disease

## 2017-04-12 ENCOUNTER — Encounter: Payer: Self-pay | Admitting: Neurology

## 2017-04-12 MED FILL — Lidocaine HCl Local Inj 1%: INTRAMUSCULAR | Qty: 20 | Status: AC

## 2017-04-15 ENCOUNTER — Encounter (HOSPITAL_COMMUNITY): Payer: Self-pay | Admitting: Cardiovascular Disease

## 2017-04-25 ENCOUNTER — Ambulatory Visit (INDEPENDENT_AMBULATORY_CARE_PROVIDER_SITE_OTHER): Payer: Medicare HMO | Admitting: *Deleted

## 2017-04-25 DIAGNOSIS — I442 Atrioventricular block, complete: Secondary | ICD-10-CM

## 2017-04-25 LAB — CUP PACEART INCLINIC DEVICE CHECK
Battery Remaining Longevity: 112 mo
Implantable Lead Location: 753859
Implantable Lead Location: 753860
Implantable Pulse Generator Implant Date: 20190123
Lead Channel Pacing Threshold Amplitude: 1 V
Lead Channel Pacing Threshold Amplitude: 1 V
Lead Channel Pacing Threshold Pulse Width: 0.5 ms
Lead Channel Pacing Threshold Pulse Width: 0.5 ms
Lead Channel Setting Pacing Pulse Width: 0.5 ms
Lead Channel Setting Sensing Sensitivity: 5 mV
MDC IDC LEAD IMPLANT DT: 19910923
MDC IDC LEAD IMPLANT DT: 19910923
MDC IDC MSMT BATTERY VOLTAGE: 2.99 V
MDC IDC MSMT LEADCHNL RA IMPEDANCE VALUE: 225 Ohm
MDC IDC MSMT LEADCHNL RA SENSING INTR AMPL: 2.1 mV
MDC IDC MSMT LEADCHNL RV IMPEDANCE VALUE: 300 Ohm
MDC IDC SESS DTM: 20190206151525
MDC IDC SET LEADCHNL RA PACING AMPLITUDE: 2.5 V
MDC IDC SET LEADCHNL RV PACING AMPLITUDE: 1.125
MDC IDC STAT BRADY RA PERCENT PACED: 17 %
MDC IDC STAT BRADY RV PERCENT PACED: 99.99 %
Pulse Gen Serial Number: 8980383

## 2017-04-25 NOTE — Patient Instructions (Signed)
Device Clinic phone number   [phone] 

## 2017-04-25 NOTE — Progress Notes (Signed)
Wound check appointment s/p gen change. Staples removed. Wound without redness or edema. Incision edges approximated, wound well healed. Normal device function. Thresholds, sensing, and impedances consistent with implant measurements. Device programmed at chronic outputs s/p gen change. Histogram distribution appropriate for patient and level of activity. 530 mode switches (<1%)--EGMs appear AT with some artifact, longest 5 minutes. No high ventricular rates noted. Patient educated about wound care, arm mobility, lifting restrictions. [facility].

## 2017-05-07 LAB — CUP PACEART INCLINIC DEVICE CHECK
Lead Channel Setting Pacing Pulse Width: 0.5 ms
MDC IDC PG IMPLANT DT: 20190123
MDC IDC SESS DTM: 20190218161317
MDC IDC SET LEADCHNL RA PACING AMPLITUDE: 1.75 V
MDC IDC SET LEADCHNL RV SENSING SENSITIVITY: 4 mV
Pulse Gen Model: 2272
Pulse Gen Serial Number: 8980383

## 2017-07-02 ENCOUNTER — Encounter: Payer: Self-pay | Admitting: *Deleted

## 2017-07-18 ENCOUNTER — Encounter: Payer: Medicare HMO | Admitting: Cardiovascular Disease

## 2017-08-07 DIAGNOSIS — H353131 Nonexudative age-related macular degeneration, bilateral, early dry stage: Secondary | ICD-10-CM | POA: Diagnosis not present

## 2017-08-07 DIAGNOSIS — Z961 Presence of intraocular lens: Secondary | ICD-10-CM | POA: Diagnosis not present

## 2017-08-08 ENCOUNTER — Encounter: Payer: Medicare HMO | Admitting: Cardiovascular Disease

## 2017-08-16 ENCOUNTER — Encounter: Payer: Self-pay | Admitting: Cardiovascular Disease

## 2017-08-16 ENCOUNTER — Ambulatory Visit: Payer: Medicare HMO | Admitting: Cardiovascular Disease

## 2017-08-16 VITALS — BP 130/62 | HR 61 | Ht 62.0 in | Wt 133.8 lb

## 2017-08-16 DIAGNOSIS — I10 Essential (primary) hypertension: Secondary | ICD-10-CM | POA: Diagnosis not present

## 2017-08-16 DIAGNOSIS — Z950 Presence of cardiac pacemaker: Secondary | ICD-10-CM

## 2017-08-16 DIAGNOSIS — I5032 Chronic diastolic (congestive) heart failure: Secondary | ICD-10-CM | POA: Diagnosis not present

## 2017-08-16 DIAGNOSIS — I442 Atrioventricular block, complete: Secondary | ICD-10-CM | POA: Diagnosis not present

## 2017-08-16 DIAGNOSIS — E782 Mixed hyperlipidemia: Secondary | ICD-10-CM | POA: Diagnosis not present

## 2017-08-16 NOTE — Progress Notes (Signed)
Patient ID: Abigail Melendez, female   DOB: Sep 18, 1920, 84 y.o.   MRN: [id number]    Cardiology Office Note    Date:  08/18/2017   ID:  Abigail Melendez, Park Meo 12/23/1920, MRN [id number]  PCP:  Gaspar Garbe, MD  Cardiologist:   Thurmon Fair, MD   Chief Complaint  Patient presents with  . Pacemaker Check    History of Present Illness:  Abigail Melendez is a 84 y.o. female with Complete heart block here for a pacemaker check.  The patient specifically denies any chest pain at rest exertion, dyspnea at rest or with exertion, orthopnea, paroxysmal nocturnal dyspnea, syncope, palpitations, focal neurological deficits, intermittent claudication, lower extremity edema, unexplained weight gain, cough, hemoptysis or wheezing.  Interrogation of her dual-chamber St. Jude Assurity DR pacemaker shows normal device function. Initial device implantation 1991 (subsequent generator changes in 2000 and in 2007 and in January 2019). Device programmed for bipolar sensing and unipolar pacing. Lead parameters remain excellent.Estimated 8-9 years battery longevity. She is pacemaker dependent due to complete heart block. She has 100% ventricular pacing and only 19% atrial pacing. She has normal heart rate histogram distribution.  Occasional "noise" on the A lead but this does not appear with normal device function  [name] has well treated systemic hypertension and hyperlipidemia. She bears a diagnosis of diastolic heart failure, but she is currently doing well without diuretics.  Last echo in 2013 showed normal LVEF moderate LVH, abnormal relaxation, no serious valvular abnormalities.   Past Medical History:  Diagnosis Date  . Anxiety   . CHB (complete heart block) (HCC) 01/22/2013   Pacemaker dependent   . CHF (congestive heart failure) (HCC)    a. echo in 2013 showed a preserved EF of greater than 55% with no significant valve abnormalities.   . Complete heart block [facility]) May 18, 1998   pacemaker  implanted  . Edema leg 10-12-1997 lower extremity venous study   pain and edema x 1 week . no dvt,large non-vascular mass located at the level of the lt. medial knee and extends to the proximal calf  . Hyperlipidemia   . Hypertension   . Hypertonicity of bladder   . Hypothyroidism   . Murmur 09-05-2011 adult echocardiogram   EF 55% mild abnormalities; no change in therapy   . Osteoporosis   . Pacemaker 05/18/1998   St.Jude affinityDR modle#5330 serial#[id number]  . Sick sinus syndrome (HCC)    a. normal cardiolite myocardial perfusion study in 2007.   Marland Kitchen Syncope, near 05-13-2003 corotid doppler   mild heterogeneous plaque noted at origins of ica andeca no evidence of significant ica stenosis . vertebral artery flow is antegrade    Past Surgical History:  Procedure Laterality Date  . CARDIAC CATHETERIZATION  11-25-1986   lt. heart cath for exertional cp and syncope;coronaries normal except very minor 20% lesion in lad possible coronary spasm  . PACEMAKER PLACEMENT  inital implant 12-10-1989   symtomatic heart block ; non rate responsive pacemaker paragon II generator  . PACEMAKER PLACEMENT  05-18-1998   new DDDR bipolar autocapture,mode switching pacesetter affinity DR pulse generator model number 5330r; serial # [id number]  . PACEMAKER PLACEMENT  02-13-2006   st. Tomi Likens DR, model [phone], serial [id number]  . PPM GENERATOR CHANGEOUT N/A 04/11/2017   Procedure: PPM GENERATOR CHANGEOUT;  Surgeon: Thurmon Fair, MD;  Location: [facility];  Service: Cardiovascular;  Laterality: N/A;    Current Medications: Outpatient Medications Prior to Visit  Medication  Sig Dispense Refill  . aspirin 81 MG tablet Take 81 mg by mouth daily.    Marland Kitchen levothyroxine (SYNTHROID, LEVOTHROID) 50 MCG tablet Take 1 tablet (50 mcg total) by mouth daily. 90 tablet 3  . losartan (COZAAR) 50 MG tablet Take 1 tablet (50 mg total) by mouth daily. 90 tablet 3  . Multiple Vitamin (MULTIVITAMIN) tablet Take 1 tablet by  mouth daily.    . pantoprazole (PROTONIX) 40 MG tablet TAKE 1 TABLET (40 MG TOTAL) BY MOUTH DAILY. 90 tablet 1  . pravastatin (PRAVACHOL) 40 MG tablet Take 1 tablet (40 mg total) by mouth daily. 90 tablet 3  . verapamil (CALAN-SR) 240 MG CR tablet Take 1 tablet (240 mg total) by mouth at bedtime. 90 tablet 3  . furosemide (LASIX) 40 MG tablet Take 40 mg by mouth daily as needed.     No facility-administered medications prior to visit.      Allergies:   Codeine   Social History   Socioeconomic History  . Marital status: Widowed    Spouse name: Not on file  . Number of children: Not on file  . Years of education: Not on file  . Highest education level: Not on file  Occupational History  . Not on file  Social Needs  . Financial resource strain: Not on file  . Food insecurity:    Worry: Not on file    Inability: Not on file  . Transportation needs:    Medical: Not on file    Non-medical: Not on file  Tobacco Use  . Smoking status: Former Games developer  . Smokeless tobacco: Never Used  Substance and Sexual Activity  . Alcohol use: Not on file  . Drug use: Not on file  . Sexual activity: Not on file  Lifestyle  . Physical activity:    Days per week: Not on file    Minutes per session: Not on file  . Stress: Not on file  Relationships  . Social connections:    Talks on phone: Not on file    Gets together: Not on file    Attends religious service: Not on file    Active member of club or organization: Not on file    Attends meetings of clubs or organizations: Not on file    Relationship status: Not on file  Other Topics Concern  . Not on file  Social History Narrative  . Not on file      ROS:   Please see the history of present illness.    ROS All other systems reviewed and are negative.   PHYSICAL EXAM:   VS:  BP 130/62   Pulse 61   Ht  (1.575 m)   Wt 133 lb 12.8 oz (60.7 kg)   BMI 24.47 kg/m     General: Alert, oriented x3, no distress, healthy left  subclavian pacemaker site Head: no evidence of trauma, PERRL, EOMI, no exophtalmos or lid lag, no myxedema, no xanthelasma; normal ears, nose and oropharynx Neck: normal jugular venous pulsations and no hepatojugular reflux; brisk carotid pulses without delay and no carotid bruits Chest: clear to auscultation, no signs of consolidation by percussion or palpation, normal fremitus, symmetrical and full respiratory excursions Cardiovascular: normal position and quality of the apical impulse, regular rhythm, normal first and paradoxically split second heart sounds, no murmurs, rubs or gallops Abdomen: no tenderness or distention, no masses by palpation, no abnormal pulsatility or arterial bruits, normal bowel sounds, no hepatosplenomegaly Extremities: no clubbing, cyanosis  or edema; 2+ radial, ulnar and brachial pulses bilaterally; 2+ right femoral, posterior tibial and dorsalis pedis pulses; 2+ left femoral, posterior tibial and dorsalis pedis pulses; no subclavian or femoral bruits Neurological: grossly nonfocal Psych: Normal mood and affect   Wt Readings from Last 3 Encounters:  08/16/17 133 lb 12.8 oz (60.7 kg)  04/11/17 132 lb (59.9 kg)  04/11/17 136 lb (61.7 kg)    Studies/Labs Reviewed:   EKG:  EKG is ordered today.  The ekg ordered today demonstrates atrial sensed ventricular paced rhythm, QTC 595 ms  Recent Labs: 04/11/2017: BUN 19; Creatinine, Ser 1.18; Hemoglobin 11.7; Platelets 150; Potassium 4.2; Sodium 140   Lipid Panel    Component Value Date/Time   CHOL 158 08/24/2015 1149   TRIG 174 (H) 08/24/2015 1149   HDL 52 08/24/2015 1149   CHOLHDL 3.0 08/24/2015 1149   VLDL 35 (H) 08/24/2015 1149   LDLCALC 71 08/24/2015 1149     ASSESSMENT:    1. CHB (complete heart block) (HCC)   2. Pacemaker   3. Mixed hyperlipidemia   4. Essential hypertension   5. Chronic diastolic heart failure (HCC)      PLAN:  In order of problems listed above:  1. CHB: pacemaker  dependent 2. PPM: Her current pacemaker is now amenable to remote monitoring and will plan downloads every 3 months and office visits yearly. 3. HLP: mildly elevated TG, otherwise good lipid profile. 4. HTN: Well-controlled 5. CHF: Seems to be doing well without diabetes.  Functional class I, no clinical hypervolemia   Medication Adjustments/Labs and Tests Ordered: Current medicines are reviewed at length with the patient today.  Concerns regarding medicines are outlined above.  Medication changes, Labs and Tests ordered today are listed in the Patient Instructions below. Patient Instructions  Dr Royann Shivers recommends that you continue on your current medications as directed. Please refer to the Current Medication list given to you today.  Remote monitoring is used to monitor your Pacemaker or ICD from home. This monitoring reduces the number of office visits required to check your device to one time per year. It allows [place] to keep an eye on the functioning of your device to ensure it is working properly. You are scheduled for a device check from home on Thursday, August 29th, 2019. You may send your transmission at any time that day. If you have a wireless device, the transmission will be sent automatically. After your physician reviews your transmission, you will receive a notification with your next transmission date.  To improve our patient care and to more adequately follow your device, [facility] has decided, as a practice, to start following each patient four times a year with your home monitor. This means that you may experience a remote appointment that is close to an in-office appointment with your physician. Your insurance will apply at the same rate as other remote monitoring transmissions.  Dr Royann Shivers recommends that you schedule a follow-up appointment in 12 months with a pacemaker check. You will receive a reminder letter in the mail two months in advance. If you don't receive a  letter, please call our office to schedule the follow-up appointment.  If you need a refill on your cardiac medications before your next appointment, please call your pharmacy.    Signed, Thurmon Fair, MD  08/18/2017 2:29 PM    [facility] [address] [place], [place], [place]  [address] Phone: [phone]; Fax: [phone]

## 2017-08-16 NOTE — Patient Instructions (Signed)
Dr Croitoru recommends that you continue on your current medications as directed. Please refer to the Current Medication list given to you today.  Remote monitoring is used to monitor your Pacemaker or ICD from home. This monitoring reduces the number of office visits required to check your device to one time per year. It allows us to keep an eye on the functioning of your device to ensure it is working properly. You are scheduled for a device check from home on Thursday, August 29th, 2019. You may send your transmission at any time that day. If you have a wireless device, the transmission will be sent automatically. After your physician reviews your transmission, you will receive a notification with your next transmission date.  To improve our patient care and to more adequately follow your device, [facility] has decided, as a practice, to start following each patient four times a year with your home monitor. This means that you may experience a remote appointment that is close to an in-office appointment with your physician. Your insurance will apply at the same rate as other remote monitoring transmissions.  Dr Croitoru recommends that you schedule a follow-up appointment in 12 months with a pacemaker check. You will receive a reminder letter in the mail two months in advance. If you don't receive a letter, please call our office to schedule the follow-up appointment.  If you need a refill on your cardiac medications before your next appointment, please call your pharmacy. 

## 2017-08-18 ENCOUNTER — Encounter: Payer: Self-pay | Admitting: Cardiovascular Disease

## 2017-08-18 DIAGNOSIS — I5032 Chronic diastolic (congestive) heart failure: Secondary | ICD-10-CM | POA: Insufficient documentation

## 2017-09-04 ENCOUNTER — Other Ambulatory Visit: Payer: Self-pay | Admitting: Student

## 2017-09-04 ENCOUNTER — Other Ambulatory Visit: Payer: Self-pay | Admitting: Cardiovascular Disease

## 2017-09-04 NOTE — Telephone Encounter (Signed)
Patient of Dr. Royann Shiversroitoru.

## 2017-09-06 NOTE — Telephone Encounter (Signed)
Patient of Dr. Royann Shiversroitoru, please address.

## 2017-09-21 ENCOUNTER — Telehealth: Payer: Self-pay | Admitting: Cardiovascular Disease

## 2017-09-21 MED ORDER — LOSARTAN POTASSIUM 50 MG PO TABS: 50.0000 mg | ORAL_TABLET | Freq: Every day | ORAL | 3 refills | Status: DC

## 2017-09-21 NOTE — Telephone Encounter (Signed)
New message  Patient only has 1 pill remaining  1. Which medications need to be refilled? (please list name of each medication and dose if known) losartan (COZAAR) 50 MG tablet  2. Which pharmacy/location (including street and city if local pharmacy) is medication to be sent to? [facility], [address]  [place], [place]  3. Do they need a 30 day or 90 day supply? 30    1. Which medications need to be refilled? (please list name of each medication and dose if known) losartan (COZAAR) 50 MG tablet  2. Which pharmacy/location (including street and city if local pharmacy) is medication to be sent to? [facility]  3. Do they need a 30 day or 90 day supply? 90

## 2017-10-01 ENCOUNTER — Other Ambulatory Visit: Payer: Self-pay | Admitting: Cardiovascular Disease

## 2017-10-01 MED ORDER — LOSARTAN POTASSIUM 50 MG PO TABS: 50.0000 mg | ORAL_TABLET | Freq: Every day | ORAL | 3 refills | Status: DC

## 2017-10-01 NOTE — Telephone Encounter (Signed)
°*  STAT* If patient is at the pharmacy, call can be transferred to refill team.   1. Which medications need to be refilled? (please list name of each medication and dose if known) Losartan 2. Which pharmacy/location (including street and city if local pharmacy) is medication to be sent to? [facility] 3. Do they need a 30 day or 90 day supply? 90 and refills

## 2017-10-29 DIAGNOSIS — E7800 Pure hypercholesterolemia, unspecified: Secondary | ICD-10-CM | POA: Diagnosis not present

## 2017-10-29 DIAGNOSIS — I10 Essential (primary) hypertension: Secondary | ICD-10-CM | POA: Diagnosis not present

## 2017-10-29 DIAGNOSIS — R82998 Other abnormal findings in urine: Secondary | ICD-10-CM | POA: Diagnosis not present

## 2017-10-29 DIAGNOSIS — E559 Vitamin D deficiency, unspecified: Secondary | ICD-10-CM | POA: Diagnosis not present

## 2017-10-29 DIAGNOSIS — E038 Other specified hypothyroidism: Secondary | ICD-10-CM | POA: Diagnosis not present

## 2017-10-30 DIAGNOSIS — Z0000 Encounter for general adult medical examination without abnormal findings: Secondary | ICD-10-CM | POA: Diagnosis not present

## 2017-10-30 DIAGNOSIS — I422 Other hypertrophic cardiomyopathy: Secondary | ICD-10-CM | POA: Diagnosis not present

## 2017-10-30 DIAGNOSIS — Z950 Presence of cardiac pacemaker: Secondary | ICD-10-CM | POA: Diagnosis not present

## 2017-10-30 DIAGNOSIS — M810 Age-related osteoporosis without current pathological fracture: Secondary | ICD-10-CM | POA: Diagnosis not present

## 2017-10-30 DIAGNOSIS — M4854XD Collapsed vertebra, not elsewhere classified, thoracic region, subsequent encounter for fracture with routine healing: Secondary | ICD-10-CM | POA: Diagnosis not present

## 2017-10-30 DIAGNOSIS — N183 Chronic kidney disease, stage 3 (moderate): Secondary | ICD-10-CM | POA: Diagnosis not present

## 2017-10-30 DIAGNOSIS — D692 Other nonthrombocytopenic purpura: Secondary | ICD-10-CM | POA: Diagnosis not present

## 2017-10-30 DIAGNOSIS — I6789 Other cerebrovascular disease: Secondary | ICD-10-CM | POA: Diagnosis not present

## 2017-10-30 DIAGNOSIS — I130 Hypertensive heart and chronic kidney disease with heart failure and stage 1 through stage 4 chronic kidney disease, or unspecified chronic kidney disease: Secondary | ICD-10-CM | POA: Diagnosis not present

## 2017-11-15 ENCOUNTER — Ambulatory Visit (INDEPENDENT_AMBULATORY_CARE_PROVIDER_SITE_OTHER): Payer: Medicare HMO | Admitting: *Deleted

## 2017-11-15 DIAGNOSIS — I442 Atrioventricular block, complete: Secondary | ICD-10-CM | POA: Diagnosis not present

## 2017-11-15 NOTE — Progress Notes (Signed)
Remote pacemaker transmission.   

## 2017-12-17 LAB — CUP PACEART REMOTE DEVICE CHECK
Battery Remaining Longevity: 101 mo
Brady Statistic AP VS Percent: 1 %
Brady Statistic AS VP Percent: 74 %
Brady Statistic AS VS Percent: 1 %
Brady Statistic RA Percent Paced: 26 %
Implantable Lead Implant Date: 19910923
Implantable Lead Implant Date: 19910923
Implantable Lead Location: 753859
Lead Channel Impedance Value: 380 Ohm
Lead Channel Pacing Threshold Amplitude: 1 V
Lead Channel Pacing Threshold Pulse Width: 0.5 ms
Lead Channel Pacing Threshold Pulse Width: 0.5 ms
Lead Channel Sensing Intrinsic Amplitude: 1.5 mV
Lead Channel Setting Pacing Amplitude: 1.125
Lead Channel Setting Pacing Pulse Width: 0.5 ms
MDC IDC LEAD LOCATION: 753860
MDC IDC MSMT BATTERY REMAINING PERCENTAGE: 95.5 %
MDC IDC MSMT BATTERY VOLTAGE: 3.01 V
MDC IDC MSMT LEADCHNL RA IMPEDANCE VALUE: 240 Ohm
MDC IDC MSMT LEADCHNL RV PACING THRESHOLD AMPLITUDE: 0.875 V
MDC IDC PG IMPLANT DT: 20190123
MDC IDC SESS DTM: 20190829080016
MDC IDC SET LEADCHNL RA PACING AMPLITUDE: 2.5 V
MDC IDC SET LEADCHNL RV SENSING SENSITIVITY: 5 mV
MDC IDC STAT BRADY AP VP PERCENT: 26 %
MDC IDC STAT BRADY RV PERCENT PACED: 99 %
Pulse Gen Model: 2272
Pulse Gen Serial Number: 8980383

## 2018-01-02 ENCOUNTER — Other Ambulatory Visit: Payer: Self-pay | Admitting: Cardiovascular Disease

## 2018-02-20 ENCOUNTER — Ambulatory Visit (INDEPENDENT_AMBULATORY_CARE_PROVIDER_SITE_OTHER): Payer: Medicare HMO

## 2018-02-20 DIAGNOSIS — I442 Atrioventricular block, complete: Secondary | ICD-10-CM | POA: Diagnosis not present

## 2018-02-20 NOTE — Progress Notes (Signed)
Remote pacemaker transmission.   

## 2018-02-26 ENCOUNTER — Encounter: Payer: Self-pay | Admitting: Cardiology

## 2018-04-12 LAB — CUP PACEART REMOTE DEVICE CHECK
Battery Remaining Percentage: 95.5 %
Battery Voltage: 3.01 V
Brady Statistic AS VP Percent: 75 %
Brady Statistic AS VS Percent: 1 %
Implantable Lead Implant Date: 19910923
Implantable Lead Implant Date: 19910923
Implantable Pulse Generator Implant Date: 20190123
Lead Channel Impedance Value: 240 Ohm
Lead Channel Pacing Threshold Amplitude: 1 V
Lead Channel Pacing Threshold Pulse Width: 0.5 ms
Lead Channel Sensing Intrinsic Amplitude: 1.7 mV
Lead Channel Setting Pacing Amplitude: 1.25 V
Lead Channel Setting Pacing Amplitude: 2.5 V
Lead Channel Setting Pacing Pulse Width: 0.5 ms
Lead Channel Setting Sensing Sensitivity: 5 mV
MDC IDC LEAD LOCATION: 753859
MDC IDC LEAD LOCATION: 753860
MDC IDC MSMT BATTERY REMAINING LONGEVITY: 98 mo
MDC IDC MSMT LEADCHNL RA PACING THRESHOLD AMPLITUDE: 1 V
MDC IDC MSMT LEADCHNL RA PACING THRESHOLD PULSEWIDTH: 0.5 ms
MDC IDC MSMT LEADCHNL RV IMPEDANCE VALUE: 360 Ohm
MDC IDC PG SERIAL: 8980383
MDC IDC SESS DTM: 20191204070013
MDC IDC STAT BRADY AP VP PERCENT: 25 %
MDC IDC STAT BRADY AP VS PERCENT: 1 %
MDC IDC STAT BRADY RA PERCENT PACED: 25 %
MDC IDC STAT BRADY RV PERCENT PACED: 99 %
Pulse Gen Model: 2272

## 2018-05-08 DIAGNOSIS — I739 Peripheral vascular disease, unspecified: Secondary | ICD-10-CM | POA: Diagnosis not present

## 2018-05-08 DIAGNOSIS — B351 Tinea unguium: Secondary | ICD-10-CM | POA: Diagnosis not present

## 2018-05-08 DIAGNOSIS — L851 Acquired keratosis [keratoderma] palmaris et plantaris: Secondary | ICD-10-CM | POA: Diagnosis not present

## 2018-05-08 DIAGNOSIS — M2011 Hallux valgus (acquired), right foot: Secondary | ICD-10-CM | POA: Diagnosis not present

## 2018-05-10 ENCOUNTER — Other Ambulatory Visit: Payer: Self-pay | Admitting: Cardiovascular Disease

## 2018-05-22 ENCOUNTER — Ambulatory Visit (INDEPENDENT_AMBULATORY_CARE_PROVIDER_SITE_OTHER): Payer: Medicare HMO | Admitting: *Deleted

## 2018-05-22 DIAGNOSIS — I5032 Chronic diastolic (congestive) heart failure: Secondary | ICD-10-CM

## 2018-05-22 DIAGNOSIS — I442 Atrioventricular block, complete: Secondary | ICD-10-CM

## 2018-05-22 LAB — CUP PACEART REMOTE DEVICE CHECK
Battery Remaining Percentage: 95.5 %
Battery Voltage: 3.01 V
Brady Statistic AP VP Percent: 23 %
Brady Statistic AS VP Percent: 77 %
Brady Statistic RV Percent Paced: 99 %
Date Time Interrogation Session: 20200304070029
Implantable Lead Implant Date: 19910923
Implantable Lead Location: 753859
Implantable Lead Location: 753860
Implantable Pulse Generator Implant Date: 20190123
Lead Channel Impedance Value: 240 Ohm
Lead Channel Impedance Value: 360 Ohm
Lead Channel Pacing Threshold Amplitude: 1 V
Lead Channel Pacing Threshold Amplitude: 1 V
Lead Channel Pacing Threshold Pulse Width: 0.5 ms
Lead Channel Setting Pacing Amplitude: 2.5 V
Lead Channel Setting Pacing Pulse Width: 0.5 ms
Lead Channel Setting Sensing Sensitivity: 5 mV
MDC IDC LEAD IMPLANT DT: 19910923
MDC IDC MSMT BATTERY REMAINING LONGEVITY: 106 mo
MDC IDC MSMT LEADCHNL RA SENSING INTR AMPL: 1.6 mV
MDC IDC MSMT LEADCHNL RV PACING THRESHOLD PULSEWIDTH: 0.5 ms
MDC IDC PG SERIAL: 8980383
MDC IDC SET LEADCHNL RV PACING AMPLITUDE: 1.25 V
MDC IDC STAT BRADY AP VS PERCENT: 1 %
MDC IDC STAT BRADY AS VS PERCENT: 1 %
MDC IDC STAT BRADY RA PERCENT PACED: 23 %

## 2018-05-30 NOTE — Progress Notes (Signed)
Remote pacemaker transmission.   

## 2018-07-02 ENCOUNTER — Other Ambulatory Visit: Payer: Self-pay | Admitting: Cardiovascular Disease

## 2018-07-02 MED ORDER — LOSARTAN POTASSIUM 50 MG PO TABS: 50.0000 mg | ORAL_TABLET | Freq: Every day | ORAL | 3 refills | Status: DC

## 2018-07-02 NOTE — Telephone Encounter (Signed)
°*  STAT* If patient is at the pharmacy, call can be transferred to refill team.   1. Which medications need to be refilled? (please list name of each medication and dose if known) losartan (COZAAR) 50 MG tablet  2. Which pharmacy/location (including street and city if local pharmacy) is medication to be sent to? [facility] [address] [place], [place] - [place] [address]  3. Do they need a 30 day or 90 day supply? 30 days

## 2018-07-02 NOTE — Telephone Encounter (Signed)
Losartan refilled 

## 2018-08-21 ENCOUNTER — Encounter: Payer: Medicare HMO | Admitting: *Deleted

## 2018-08-21 LAB — CUP PACEART REMOTE DEVICE CHECK
Battery Remaining Longevity: 107 mo
Battery Remaining Percentage: 95.5 %
Battery Voltage: 3.01 V
Brady Statistic AP VP Percent: 23 %
Brady Statistic AP VS Percent: 1 %
Brady Statistic AS VP Percent: 77 %
Brady Statistic AS VS Percent: 1 %
Brady Statistic RA Percent Paced: 23 %
Brady Statistic RV Percent Paced: 99 %
Date Time Interrogation Session: 20200603060013
Implantable Lead Implant Date: 19910923
Implantable Lead Implant Date: 19910923
Implantable Lead Location: 753859
Implantable Lead Location: 753860
Implantable Pulse Generator Implant Date: 20190123
Lead Channel Impedance Value: 240 Ohm
Lead Channel Impedance Value: 380 Ohm
Lead Channel Pacing Threshold Amplitude: 0.875 V
Lead Channel Pacing Threshold Amplitude: 1 V
Lead Channel Pacing Threshold Pulse Width: 0.5 ms
Lead Channel Pacing Threshold Pulse Width: 0.5 ms
Lead Channel Sensing Intrinsic Amplitude: 1.7 mV
Lead Channel Setting Pacing Amplitude: 1.125
Lead Channel Setting Pacing Amplitude: 2.5 V
Lead Channel Setting Pacing Pulse Width: 0.5 ms
Lead Channel Setting Sensing Sensitivity: 5 mV
Pulse Gen Model: 2272
Pulse Gen Serial Number: 8980383

## 2018-09-01 ENCOUNTER — Other Ambulatory Visit: Payer: Self-pay | Admitting: Student

## 2018-09-04 ENCOUNTER — Telehealth: Payer: Self-pay | Admitting: *Deleted

## 2018-09-04 NOTE — Telephone Encounter (Signed)
Pt called for an appt.

## 2018-09-09 ENCOUNTER — Other Ambulatory Visit: Payer: Self-pay

## 2018-09-09 ENCOUNTER — Ambulatory Visit (INDEPENDENT_AMBULATORY_CARE_PROVIDER_SITE_OTHER): Payer: Medicare HMO | Admitting: Podiatry

## 2018-09-09 ENCOUNTER — Encounter: Payer: Self-pay | Admitting: Podiatry

## 2018-09-09 VITALS — Temp 97.3°F

## 2018-09-09 DIAGNOSIS — L309 Dermatitis, unspecified: Secondary | ICD-10-CM

## 2018-09-09 DIAGNOSIS — B351 Tinea unguium: Secondary | ICD-10-CM | POA: Diagnosis not present

## 2018-09-09 DIAGNOSIS — M79676 Pain in unspecified toe(s): Secondary | ICD-10-CM

## 2018-09-09 DIAGNOSIS — M76821 Posterior tibial tendinitis, right leg: Secondary | ICD-10-CM | POA: Diagnosis not present

## 2018-09-09 MED ORDER — METHYLPREDNISOLONE 4 MG PO TBPK: ORAL_TABLET | ORAL | 0 refills | Status: DC

## 2018-09-09 MED ORDER — CLOBETASOL PROPIONATE 0.05 % EX CREA: 1.0000 "application " | TOPICAL_CREAM | Freq: Two times a day (BID) | CUTANEOUS | 2 refills | Status: AC

## 2018-09-11 NOTE — Progress Notes (Signed)
HPI: 85 year old female presenting today as a new patient with a chief complaint of right ankle pain that began about one year ago. She reports associated swelling and notes a rash as well. She states she was seen by a physician in [facility] about four months ago and was prescribed ammonium lactate 12% lotion. She states it has not provided any significant relief. Walking or standing for extended periods of time increases the pain in the ankle. She has not had any treatment for the ankle pain. Patient is here for further evaluation and treatment.   Past Medical History:  Diagnosis Date  . Anxiety   . CHB (complete heart block) ([place]) 01/22/2013   Pacemaker dependent   . CHF (congestive heart failure) ([place])    a. echo in 2013 showed a preserved EF of greater than 55% with no significant valve abnormalities.   . Complete heart block [facility]) May 18, 1998   pacemaker implanted  . Edema leg 10-12-1997 lower extremity venous study   pain and edema x 1 week . no dvt,large non-vascular mass located at the level of the lt. medial knee and extends to the proximal calf  . Hyperlipidemia   . Hypertension   . Hypertonicity of bladder   . Hypothyroidism   . Murmur 09-05-2011 adult echocardiogram   EF 55% mild abnormalities; no change in therapy   . Osteoporosis   . Pacemaker 05/18/1998   St.Jude affinityDR modle#5330 serial#[id number]  . Sick sinus syndrome (HCC)    a. normal cardiolite myocardial perfusion study in 2007.   Marland Kitchen Syncope, near 05-13-2003 corotid doppler   mild heterogeneous plaque noted at origins of ica andeca no evidence of significant ica stenosis . vertebral artery flow is antegrade       Physical Exam: General: The patient is alert and oriented x3 in no acute distress.  Dermatology: Nails are tender, long, thickened and dystrophic with subungual debris, consistent with onychomycosis, 1-5 bilateral. No signs of infection noted. Dermatitis of the right ankle noted. Skin is  warm, dry and supple bilateral lower extremities.   Vascular: Palpable pedal pulses bilaterally. No edema or erythema noted. Capillary refill within normal limits.  Neurological: Epicritic and protective threshold grossly intact bilaterally.   Musculoskeletal Exam: Pain on palpation noted to the posterior tibial tendon of the right foot. Range of motion within normal limits. Muscle strength 5/5 in all muscle groups bilateral lower extremities.  Assessment: 1. Posterior tibial tendinitis right  2. Onychodystrophic nails 1-5 bilateral with hyperkeratosis of nails.  3. Onychomycosis of nail due to dermatophyte bilateral 4. Dermatitis right ankle    Plan of Care:  1. Patient was evaluated.. 2. Injection of 0.5 mL Celestone Soluspan injected into the posterior tibial tendon sheath.  3. Prescription for Medrol Dose Pak provided to patient. 4. Prescription for Temovate 0.05% cream provided to patient.  5. Mechanical debridement of nails 1-5 bilaterally performed using a nail nipper. Filed with dremel without incident.  6. Return to clinic in 3 weeks.    Edrick Kins, DPM [facility]  Dr. Edrick Kins, [address]                                        [place],  [address]                Office [phone]  Fax [phone]

## 2018-09-30 ENCOUNTER — Encounter: Payer: Self-pay | Admitting: Podiatry

## 2018-09-30 ENCOUNTER — Other Ambulatory Visit: Payer: Self-pay

## 2018-09-30 ENCOUNTER — Ambulatory Visit: Payer: Medicare HMO | Admitting: Podiatry

## 2018-09-30 ENCOUNTER — Ambulatory Visit (INDEPENDENT_AMBULATORY_CARE_PROVIDER_SITE_OTHER): Payer: Medicare HMO | Admitting: Podiatry

## 2018-09-30 VITALS — Temp 98.0°F

## 2018-09-30 DIAGNOSIS — M76821 Posterior tibial tendinitis, right leg: Secondary | ICD-10-CM

## 2018-09-30 DIAGNOSIS — M21611 Bunion of right foot: Secondary | ICD-10-CM

## 2018-09-30 MED ORDER — LIDOCAINE 5 % EX PTCH: 1.0000 | MEDICATED_PATCH | CUTANEOUS | 3 refills | Status: AC

## 2018-10-02 NOTE — Progress Notes (Signed)
   Subjective: 85 year old female presenting today for follow up evaluation of PT tendinitis of the right lower extremity. She states she is doing well and is improving. She reports a decrease in pain and swelling. She has completed the Medrol Dose Pak prescribed at the previous visit. She denies modifying factors. Patient is here for further evaluation and treatment.   Past Medical History:  Diagnosis Date  . Anxiety   . CHB (complete heart block) ([place]) 01/22/2013   Pacemaker dependent   . CHF (congestive heart failure) ([place])    a. echo in 2013 showed a preserved EF of greater than 55% with no significant valve abnormalities.   . Complete heart block [facility] May 18, 1998   pacemaker implanted  . Edema leg 10-12-1997 lower extremity venous study   pain and edema x 1 week . no dvt,large non-vascular mass located at the level of the lt. medial knee and extends to the proximal calf  . Hyperlipidemia   . Hypertension   . Hypertonicity of bladder   . Hypothyroidism   . Murmur 09-05-2011 adult echocardiogram   EF 55% mild abnormalities; no change in therapy   . Osteoporosis   . Pacemaker 05/18/1998   St.Jude affinityDR modle#5330 serial#[id number]  . Sick sinus syndrome (HCC)    a. normal cardiolite myocardial perfusion study in 2007.   Marland Kitchen Syncope, near 05-13-2003 corotid doppler   mild heterogeneous plaque noted at origins of ica andeca no evidence of significant ica stenosis . vertebral artery flow is antegrade      Objective: Physical Exam General: The patient is alert and oriented x3 in no acute distress.  Dermatology: Skin is cool, dry and supple bilateral lower extremities. Negative for open lesions or macerations.  Vascular: Palpable pedal pulses bilaterally. No edema or erythema noted. Capillary refill within normal limits.  Neurological: Epicritic and protective threshold grossly intact bilaterally.   Musculoskeletal Exam: Clinical evidence of bunion deformity noted to the  respective foot. There is moderate pain on palpation range of motion of the first MPJ. Lateral deviation of the hallux noted consistent with hallux abductovalgus.  Assessment: 1. HAV w/ bunion deformity right  2. Posterior tibial tendinitis right - resolved  3. Dermatitis right ankle - resolved     Plan of Care:  1. Patient was evaluated.  2. Prescription for Lidocaine 5% patches for bunion to be used QHS.  3. Recommended good shoe gear.  4. Return to clinic as needed.   Edrick Kins, DPM [facility]  Dr. Edrick Kins, [address]                                        [place], [place] [address]                Office [phone]  Fax [phone]

## 2018-10-03 ENCOUNTER — Telehealth: Payer: Self-pay | Admitting: *Deleted

## 2018-10-03 NOTE — Telephone Encounter (Signed)
Incoming [employer] fax denied Lidocaine 5% patch. Dr. Amalia Hailey request appeal information and clinicals with statement that pt is 85 yo, hypertensive with kidney problems, to be sent to [facility]. Faxed required forms to [facility]

## 2018-10-10 ENCOUNTER — Telehealth: Payer: Self-pay | Admitting: Cardiovascular Disease

## 2018-10-10 NOTE — Telephone Encounter (Signed)

## 2018-10-11 ENCOUNTER — Encounter: Payer: Self-pay | Admitting: Cardiovascular Disease

## 2018-10-11 ENCOUNTER — Other Ambulatory Visit: Payer: Self-pay | Admitting: Cardiovascular Disease

## 2018-10-11 ENCOUNTER — Telehealth (INDEPENDENT_AMBULATORY_CARE_PROVIDER_SITE_OTHER): Payer: Medicare HMO | Admitting: Cardiovascular Disease

## 2018-10-11 DIAGNOSIS — I10 Essential (primary) hypertension: Secondary | ICD-10-CM

## 2018-10-11 DIAGNOSIS — R5381 Other malaise: Secondary | ICD-10-CM

## 2018-10-11 DIAGNOSIS — I442 Atrioventricular block, complete: Secondary | ICD-10-CM | POA: Diagnosis not present

## 2018-10-11 DIAGNOSIS — E7800 Pure hypercholesterolemia, unspecified: Secondary | ICD-10-CM

## 2018-10-11 DIAGNOSIS — I5032 Chronic diastolic (congestive) heart failure: Secondary | ICD-10-CM

## 2018-10-11 DIAGNOSIS — I110 Hypertensive heart disease with heart failure: Secondary | ICD-10-CM | POA: Diagnosis not present

## 2018-10-11 DIAGNOSIS — Z950 Presence of cardiac pacemaker: Secondary | ICD-10-CM | POA: Diagnosis not present

## 2018-10-11 NOTE — Progress Notes (Signed)
Virtual Visit via Telephone Note   This visit type was conducted due to national recommendations for restrictions regarding the COVID-19 Pandemic (e.g. social distancing) in an effort to limit this patient's exposure and mitigate transmission in our community.  Due to her co-morbid illnesses, this patient is at least at moderate risk for complications without adequate follow up.  This format is felt to be most appropriate for this patient at this time.  The patient did not have access to video technology/had technical difficulties with video requiring transitioning to audio format only (telephone).  All issues noted in this document were discussed and addressed.  No physical exam could be performed with this format.  Please refer to the patient's chart for her  consent to telehealth for [facility].   Date:  10/11/2018   ID:  Abigail Melendez, DOB September 05, 1920, MRN [id number]  Patient Location: Home Provider Location: Home  PCP:  Tisovec, Adelfa Kohichard W, MD  Cardiologist:  Karen Kinnard Electrophysiologist:  None   Evaluation Performed:  Follow-Up Visit  Chief Complaint:   CHB, pacemaker, CHF  History of Present Illness:    Abigail Melendez is a 85 y.o. female with complete heart block, dual-chamber permanent pacemaker (St Jude Assurity, initial implant unipolar leads [phone], most recent generator change January 2019), chronic diastolic heart failure, mild mixed hyperlipidemia, essential hypertension.  She has done quite well since her last appointment.  She has not had any episodes of heart failure exacerbation and remains well compensated without the need for daily diuretics.  She lives independently, with frequent assistance from her son (who participated in this phone visit).  She takes care of all the easier household chores.  She has been staying inside since the coronavirus pandemic began.  Her most recent pacemaker download from August 21, 2018 showed mostly normal findings.  Estimated generator  longevity is about 9 years.  She has 23% atrial pacing and 99% ventricular pacing.  Her leads are only unipolar leads from 1991 and she has occasional "noise" on the atrial lead, but this does not really interfere with normal device function.  The patient specifically denies any chest pain at rest exertion, dyspnea at rest or with exertion, orthopnea, paroxysmal nocturnal dyspnea, syncope, palpitations, focal neurological deficits, intermittent claudication, lower extremity edema, unexplained weight gain, cough, hemoptysis or wheezing.  Her most recent echocardiogram was performed in 2013 and showed normal left ventricular systolic function, moderate LVH with abnormal relaxation and no serious valvular abnormalities.  She had a cardiac catheterization with normal vessels in 1988, normal nuclear stress test in 2007 and has not really had any reason for repeat evaluation since then.  The patient does not have symptoms concerning for COVID-19 infection (fever, chills, cough, or new shortness of breath).    Past Medical History:  Diagnosis Date  . Anxiety   . CHB (complete heart block) (HCC) 01/22/2013   Pacemaker dependent   . CHF (congestive heart failure) (HCC)    a. echo in 2013 showed a preserved EF of greater than 55% with no significant valve abnormalities.   . Complete heart block [facility](HCC) May 18, 1998   pacemaker implanted  . Edema leg 10-12-1997 lower extremity venous study   pain and edema x 1 week . no dvt,large non-vascular mass located at the level of the lt. medial knee and extends to the proximal calf  . Hyperlipidemia   . Hypertension   . Hypertonicity of bladder   . Hypothyroidism   . Murmur 09-05-2011 adult echocardiogram  EF 55% mild abnormalities; no change in therapy   . Osteoporosis   . Pacemaker 05/18/1998   St.Jude affinityDR modle#5330 serial#[id number]  . Sick sinus syndrome (HCC)    a. normal cardiolite myocardial perfusion study in 2007.   Marland Kitchen Syncope, near 05-13-2003  corotid doppler   mild heterogeneous plaque noted at origins of ica andeca no evidence of significant ica stenosis . vertebral artery flow is antegrade   Past Surgical History:  Procedure Laterality Date  . CARDIAC CATHETERIZATION  11-25-1986   lt. heart cath for exertional cp and syncope;coronaries normal except very minor 20% lesion in lad possible coronary spasm  . PACEMAKER PLACEMENT  inital implant 12-10-1989   symtomatic heart block ; non rate responsive pacemaker paragon II generator  . PACEMAKER PLACEMENT  05-18-1998   new DDDR bipolar autocapture,mode switching pacesetter affinity DR pulse generator model number 5330r; serial # [id number]  . PACEMAKER PLACEMENT  02-13-2006   st. [place] Ped DR, model [id number], serial [id number]  . PPM GENERATOR CHANGEOUT N/A 04/11/2017   Procedure: PPM GENERATOR CHANGEOUT;  Surgeon: Sanda Klein, MD;  Location: [facility];  Service: Cardiovascular;  Laterality: N/A;     Current Meds  Medication Sig  . aspirin 81 MG tablet Take 81 mg by mouth daily.  . clobetasol cream (TEMOVATE) 6.31 % Apply 1 application topically 2 (two) times daily.  Marland Kitchen levothyroxine (SYNTHROID) 75 MCG tablet   . losartan (COZAAR) 50 MG tablet Take 1 tablet (50 mg total) by mouth daily.  . Multiple Vitamin (MULTIVITAMIN) tablet Take 1 tablet by mouth daily.  . pantoprazole (PROTONIX) 40 MG tablet TAKE 1 TABLET EVERY DAY  . pravastatin (PRAVACHOL) 40 MG tablet TAKE 1 TABLET (40 MG TOTAL) BY MOUTH DAILY.  . verapamil (CALAN-SR) 240 MG CR tablet TAKE 1 TABLET AT BEDTIME     Allergies:   Codeine   Social History   Tobacco Use  . Smoking status: Former Research scientist (life sciences)  . Smokeless tobacco: Never Used  Substance Use Topics  . Alcohol use: Not on file  . Drug use: Not on file     Family Hx: The patient's family history is not on file.  ROS:   Please see the history of present illness.    All other systems reviewed and are negative.   Prior CV studies:   The following  studies were reviewed today: Pacemaker download August 21, 2018  Labs/Other Tests and Data Reviewed:    EKG:  Intracardiac electrogram shows atrial sensed, ventricular paced rhythm  Recent Labs: October 29, 2017 labs from Dr. Osborne Casco Creatinine 1.1, potassium 4.9, hemoglobin 11.9, normal liver function tests TSH 13.57  Recent Lipid Panel Lab Results  Component Value Date/Time   CHOL 158 08/24/2015 11:49 AM   TRIG 174 (H) 08/24/2015 11:49 AM   HDL 52 08/24/2015 11:49 AM   CHOLHDL 3.0 08/24/2015 11:49 AM   LDLCALC 71 08/24/2015 11:49 AM  October 29, 2017 Total cholesterol 137, triglycerides 93, HDL 50, LDL 68  Wt Readings from Last 3 Encounters:  10/11/18 136 lb (61.7 kg)  08/16/17 133 lb 12.8 oz (60.7 kg)  04/11/17 132 lb (59.9 kg)     Objective:    Vital Signs:  Ht 5\' 2"  (1.575 m)   Wt 136 lb (61.7 kg)   BMI 24.87 kg/m    VITAL SIGNS:  reviewed unable to examine  ASSESSMENT & PLAN:    1. CHB (complete heart block) ([place])   2. Chronic diastolic heart failure ([place])  3. Essential hypertension   4. Pure hypercholesterolemia   5. Pacemaker   6. Physical deconditioning   1.  2. CHB: Pacemaker dependent. 3. PM: Some issues with atrial oversensing with her unipolar leads, but this is not really causing issues with device function.  Otherwise normal pacemaker, plan remote downloads every 3 months. 4. CHF: She had evidence of LVH and diastolic dysfunction on echo but really does not have any clinical heart failure, she is not taking diuretics. 5.  HLP: Excellent lipid parameters on current medical regimen. 6.  HTN: Unable to check blood pressure today, will be seeing PCP next month. 7. Deconditioning: If anything, she seems to be doing better than a year ago.  Not bad for 85 years old.  COVID-19 Education: The signs and symptoms of COVID-19 were discussed with the patient and how to seek care for testing (follow up with PCP or arrange E-visit).  The importance of social  distancing was discussed today.  Time:   Today, I have spent 16 minutes with the patient with telehealth technology discussing the above problems.     Medication Adjustments/Labs and Tests Ordered: Current medicines are reviewed at length with the patient today.  Concerns regarding medicines are outlined above.   Tests Ordered: No orders of the defined types were placed in this encounter.   Medication Changes: No orders of the defined types were placed in this encounter.   Follow Up:  Virtual Visit or In Person 1 year  Signed, Thurmon FairMihai Analina Filla, MD  10/11/2018 11:23 AM    [facility]

## 2018-10-11 NOTE — Patient Instructions (Signed)

## 2018-10-11 NOTE — Telephone Encounter (Signed)
Rx(s) sent to pharmacy electronically.  

## 2018-10-16 ENCOUNTER — Other Ambulatory Visit: Payer: Self-pay | Admitting: Cardiovascular Disease

## 2018-10-16 NOTE — Telephone Encounter (Signed)
°*  STAT* If patient is at the pharmacy, call can be transferred to refill team.   1. Which medications need to be refilled? (please list name of each medication and dose if known) -need a prescrption to her local RX until her mail order comes for Verapamil  2. Which pharmacy/location (including street and city if local pharmacy) is medication to be sent to? [place] [phone]  3. Do they need a 30 day or 90 day supply? #30

## 2018-10-17 NOTE — Telephone Encounter (Signed)
Follow up   Patient states that she has not received the below medication. Please send the medication up for refill.

## 2018-10-18 MED ORDER — VERAPAMIL HCL ER 240 MG PO TBCR: 240.0000 mg | EXTENDED_RELEASE_TABLET | Freq: Every day | ORAL | 0 refills | Status: DC

## 2018-10-18 NOTE — Telephone Encounter (Signed)
Rx(s) sent to pharmacy electronically.  

## 2018-10-28 ENCOUNTER — Other Ambulatory Visit: Payer: Self-pay

## 2018-10-28 ENCOUNTER — Other Ambulatory Visit: Payer: Self-pay | Admitting: Cardiovascular Disease

## 2018-10-28 DIAGNOSIS — E7800 Pure hypercholesterolemia, unspecified: Secondary | ICD-10-CM | POA: Diagnosis not present

## 2018-10-28 DIAGNOSIS — E038 Other specified hypothyroidism: Secondary | ICD-10-CM | POA: Diagnosis not present

## 2018-10-28 DIAGNOSIS — E559 Vitamin D deficiency, unspecified: Secondary | ICD-10-CM | POA: Diagnosis not present

## 2018-10-28 MED ORDER — PRAVASTATIN SODIUM 40 MG PO TABS: 40.0000 mg | ORAL_TABLET | Freq: Every day | ORAL | 0 refills | Status: DC

## 2018-10-28 MED ORDER — PANTOPRAZOLE SODIUM 40 MG PO TBEC: 40.0000 mg | DELAYED_RELEASE_TABLET | Freq: Every day | ORAL | 3 refills | Status: AC

## 2018-10-28 NOTE — Telephone Encounter (Signed)
Rx(s) sent to pharmacy electronically.  

## 2018-10-28 NOTE — Telephone Encounter (Signed)
 *  STAT* If patient is at the pharmacy, call can be transferred to refill team.   1. Which medications need to be refilled? (please list name of each medication and dose if known) pantoprazole (PROTONIX) 40 MG tablet  2. Which pharmacy/location (including street and city if local pharmacy) is medication to be sent to? [place]   3. Do they need a 30 day or 90 day supply? [facility] is out of this medication at this time and patient is completely out of medication and is requesting a 30 day supply to be sent local to get her through till [facility] gets it back in stock.

## 2018-10-30 DIAGNOSIS — I130 Hypertensive heart and chronic kidney disease with heart failure and stage 1 through stage 4 chronic kidney disease, or unspecified chronic kidney disease: Secondary | ICD-10-CM | POA: Diagnosis not present

## 2018-10-30 DIAGNOSIS — R82998 Other abnormal findings in urine: Secondary | ICD-10-CM | POA: Diagnosis not present

## 2018-11-04 DIAGNOSIS — I422 Other hypertrophic cardiomyopathy: Secondary | ICD-10-CM | POA: Diagnosis not present

## 2018-11-04 DIAGNOSIS — D692 Other nonthrombocytopenic purpura: Secondary | ICD-10-CM | POA: Diagnosis not present

## 2018-11-04 DIAGNOSIS — I5032 Chronic diastolic (congestive) heart failure: Secondary | ICD-10-CM | POA: Diagnosis not present

## 2018-11-04 DIAGNOSIS — E46 Unspecified protein-calorie malnutrition: Secondary | ICD-10-CM | POA: Diagnosis not present

## 2018-11-04 DIAGNOSIS — N183 Chronic kidney disease, stage 3 (moderate): Secondary | ICD-10-CM | POA: Diagnosis not present

## 2018-11-04 DIAGNOSIS — Z0000 Encounter for general adult medical examination without abnormal findings: Secondary | ICD-10-CM | POA: Diagnosis not present

## 2018-11-04 DIAGNOSIS — I679 Cerebrovascular disease, unspecified: Secondary | ICD-10-CM | POA: Diagnosis not present

## 2018-11-04 DIAGNOSIS — E559 Vitamin D deficiency, unspecified: Secondary | ICD-10-CM | POA: Diagnosis not present

## 2018-11-04 DIAGNOSIS — I130 Hypertensive heart and chronic kidney disease with heart failure and stage 1 through stage 4 chronic kidney disease, or unspecified chronic kidney disease: Secondary | ICD-10-CM | POA: Diagnosis not present

## 2018-11-04 DIAGNOSIS — Z950 Presence of cardiac pacemaker: Secondary | ICD-10-CM | POA: Diagnosis not present

## 2018-11-04 DIAGNOSIS — Z1331 Encounter for screening for depression: Secondary | ICD-10-CM | POA: Diagnosis not present

## 2018-11-08 ENCOUNTER — Other Ambulatory Visit: Payer: Self-pay | Admitting: Cardiovascular Disease

## 2018-11-19 DIAGNOSIS — H353131 Nonexudative age-related macular degeneration, bilateral, early dry stage: Secondary | ICD-10-CM | POA: Diagnosis not present

## 2018-11-19 DIAGNOSIS — Z961 Presence of intraocular lens: Secondary | ICD-10-CM | POA: Diagnosis not present

## 2018-11-20 ENCOUNTER — Ambulatory Visit (INDEPENDENT_AMBULATORY_CARE_PROVIDER_SITE_OTHER): Payer: Medicare HMO | Admitting: *Deleted

## 2018-11-20 DIAGNOSIS — I442 Atrioventricular block, complete: Secondary | ICD-10-CM | POA: Diagnosis not present

## 2018-11-21 LAB — CUP PACEART REMOTE DEVICE CHECK
Battery Remaining Longevity: 106 mo
Battery Remaining Percentage: 95.5 %
Battery Voltage: 3.01 V
Brady Statistic AP VP Percent: 24 %
Brady Statistic AP VS Percent: 1 %
Brady Statistic AS VP Percent: 76 %
Brady Statistic AS VS Percent: 1 %
Brady Statistic RA Percent Paced: 24 %
Brady Statistic RV Percent Paced: 99 %
Date Time Interrogation Session: 20200902060015
Implantable Lead Implant Date: 19910923
Implantable Lead Implant Date: 19910923
Implantable Lead Location: 753859
Implantable Lead Location: 753860
Implantable Pulse Generator Implant Date: 20190123
Lead Channel Impedance Value: 240 Ohm
Lead Channel Impedance Value: 360 Ohm
Lead Channel Pacing Threshold Amplitude: 0.875 V
Lead Channel Pacing Threshold Amplitude: 1 V
Lead Channel Pacing Threshold Pulse Width: 0.5 ms
Lead Channel Pacing Threshold Pulse Width: 0.5 ms
Lead Channel Sensing Intrinsic Amplitude: 1.7 mV
Lead Channel Setting Pacing Amplitude: 1.125
Lead Channel Setting Pacing Amplitude: 2.5 V
Lead Channel Setting Pacing Pulse Width: 0.5 ms
Lead Channel Setting Sensing Sensitivity: 5 mV
Pulse Gen Model: 2272
Pulse Gen Serial Number: 8980383

## 2018-12-05 ENCOUNTER — Encounter: Payer: Self-pay | Admitting: Cardiology

## 2018-12-05 NOTE — Progress Notes (Signed)
Remote pacemaker transmission.   

## 2018-12-17 ENCOUNTER — Other Ambulatory Visit: Payer: Self-pay | Admitting: Cardiovascular Disease

## 2019-02-18 ENCOUNTER — Telehealth: Payer: Self-pay | Admitting: Cardiovascular Disease

## 2019-02-18 NOTE — Telephone Encounter (Signed)
Patient's son would like to know how to send in a transmission for his mother's appointment tomorrow.

## 2019-02-19 ENCOUNTER — Ambulatory Visit (INDEPENDENT_AMBULATORY_CARE_PROVIDER_SITE_OTHER): Payer: Medicare HMO | Admitting: *Deleted

## 2019-02-19 DIAGNOSIS — I442 Atrioventricular block, complete: Secondary | ICD-10-CM

## 2019-02-19 LAB — CUP PACEART REMOTE DEVICE CHECK
Battery Remaining Longevity: 105 mo
Battery Remaining Percentage: 95.5 %
Battery Voltage: 3.01 V
Brady Statistic AP VP Percent: 23 %
Brady Statistic AP VS Percent: 1 %
Brady Statistic AS VP Percent: 77 %
Brady Statistic AS VS Percent: 1 %
Brady Statistic RA Percent Paced: 23 %
Brady Statistic RV Percent Paced: 99 %
Date Time Interrogation Session: 20201202024835
Implantable Lead Implant Date: 19910923
Implantable Lead Implant Date: 19910923
Implantable Lead Location: 753859
Implantable Lead Location: 753860
Implantable Pulse Generator Implant Date: 20190123
Lead Channel Impedance Value: 240 Ohm
Lead Channel Impedance Value: 360 Ohm
Lead Channel Pacing Threshold Amplitude: 0.875 V
Lead Channel Pacing Threshold Amplitude: 1 V
Lead Channel Pacing Threshold Pulse Width: 0.5 ms
Lead Channel Pacing Threshold Pulse Width: 0.5 ms
Lead Channel Sensing Intrinsic Amplitude: 1.7 mV
Lead Channel Setting Pacing Amplitude: 1.125
Lead Channel Setting Pacing Amplitude: 2.5 V
Lead Channel Setting Pacing Pulse Width: 0.5 ms
Lead Channel Setting Sensing Sensitivity: 5 mV
Pulse Gen Model: 2272
Pulse Gen Serial Number: 8980383

## 2019-02-19 NOTE — Telephone Encounter (Signed)
I spoke with the pt son and let him know that we already got the transmission from the pt monitor. All the pt has to do is sleep by the monitor and the monitor will automatically transmit. They do not have to do anything unless we call and ask for a transmission. The pt son verbalized understanding.

## 2019-03-06 DIAGNOSIS — Z77122 Contact with and (suspected) exposure to noise: Secondary | ICD-10-CM | POA: Diagnosis not present

## 2019-03-06 DIAGNOSIS — Z885 Allergy status to narcotic agent status: Secondary | ICD-10-CM | POA: Diagnosis not present

## 2019-03-06 DIAGNOSIS — H93293 Other abnormal auditory perceptions, bilateral: Secondary | ICD-10-CM | POA: Diagnosis not present

## 2019-03-06 DIAGNOSIS — H903 Sensorineural hearing loss, bilateral: Secondary | ICD-10-CM | POA: Diagnosis not present

## 2019-03-17 NOTE — Progress Notes (Signed)
PPM remote 

## 2019-04-14 DIAGNOSIS — M25571 Pain in right ankle and joints of right foot: Secondary | ICD-10-CM | POA: Diagnosis not present

## 2019-04-14 DIAGNOSIS — M79671 Pain in right foot: Secondary | ICD-10-CM | POA: Diagnosis not present

## 2019-04-14 DIAGNOSIS — I8310 Varicose veins of unspecified lower extremity with inflammation: Secondary | ICD-10-CM | POA: Diagnosis not present

## 2019-04-21 ENCOUNTER — Ambulatory Visit: Payer: Medicare HMO | Admitting: Podiatry

## 2019-04-21 ENCOUNTER — Other Ambulatory Visit: Payer: Self-pay

## 2019-04-21 DIAGNOSIS — M21611 Bunion of right foot: Secondary | ICD-10-CM

## 2019-04-21 DIAGNOSIS — M76821 Posterior tibial tendinitis, right leg: Secondary | ICD-10-CM | POA: Diagnosis not present

## 2019-04-21 MED ORDER — METHYLPREDNISOLONE 4 MG PO TBPK: ORAL_TABLET | ORAL | 0 refills | Status: DC

## 2019-04-24 NOTE — Progress Notes (Signed)
    HPI: 86 y.o. female presenting today for follow up evaluation of right foot pain. She states the pain is the same as it was at her last visit 7 months ago. She reports burning pain to the right medial foot. Walking increases the pain. She had an X-Ray done on 04/14/2019 and has the reports with her. Patient is here for further evaluation and treatment.   Past Medical History:  Diagnosis Date  . Anxiety   . CHB (complete heart block) (HCC) 01/22/2013   Pacemaker dependent   . CHF (congestive heart failure) (HCC)    a. echo in 2013 showed a preserved EF of greater than 55% with no significant valve abnormalities.   . Complete heart block [facility]) May 18, 1998   pacemaker implanted  . Edema leg 10-12-1997 lower extremity venous study   pain and edema x 1 week . no dvt,large non-vascular mass located at the level of the lt. medial knee and extends to the proximal calf  . Hyperlipidemia   . Hypertension   . Hypertonicity of bladder   . Hypothyroidism   . Murmur 09-05-2011 adult echocardiogram   EF 55% mild abnormalities; no change in therapy   . Osteoporosis   . Pacemaker 05/18/1998   St.Jude affinityDR modle#5330 serial#[id number]  . Sick sinus syndrome (HCC)    a. normal cardiolite myocardial perfusion study in 2007.   Marland Kitchen Syncope, near 05-13-2003 corotid doppler   mild heterogeneous plaque noted at origins of ica andeca no evidence of significant ica stenosis . vertebral artery flow is antegrade       Physical Exam: General: The patient is alert and oriented x3 in no acute distress.  Dermatology: Skin is warm, dry and supple bilateral lower extremities. Negative for open lesions or macerations.  Vascular: Palpable pedal pulses bilaterally. No edema or erythema noted. Capillary refill within normal limits.  Neurological: Epicritic and protective threshold grossly intact bilaterally.   Musculoskeletal Exam: Pain on palpation noted to the posterior tibial tendon of the right foot.  Range of motion within normal limits. Muscle strength 5/5 in all muscle groups bilateral lower extremities.  Assessment: 1. Posterior tibial tendinitis right    Plan of Care:  1. Patient was evaluated. Radiograph reports from PCP was reviewed today. 2. Injection of 0.5 mL Celestone Soluspan injected into the posterior tibial tendon sheath.  3. Prescription for Medrol Dose Pak provided to patient. 4. Recommended good shoe gear.  5. Return to clinic as needed.    Felecia Shelling, DPM [facility]  Dr. Felecia Shelling, DPM    [address]                                        [place], [place] [address]                Office [phone]  Fax [phone]

## 2019-05-21 ENCOUNTER — Ambulatory Visit (INDEPENDENT_AMBULATORY_CARE_PROVIDER_SITE_OTHER): Payer: Medicare HMO | Admitting: *Deleted

## 2019-05-21 DIAGNOSIS — I442 Atrioventricular block, complete: Secondary | ICD-10-CM | POA: Diagnosis not present

## 2019-05-21 LAB — CUP PACEART REMOTE DEVICE CHECK
Battery Remaining Longevity: 106 mo
Battery Remaining Percentage: 95.5 %
Battery Voltage: 3.01 V
Brady Statistic AP VP Percent: 23 %
Brady Statistic AP VS Percent: 1 %
Brady Statistic AS VP Percent: 77 %
Brady Statistic AS VS Percent: 1 %
Brady Statistic RA Percent Paced: 23 %
Brady Statistic RV Percent Paced: 99 %
Date Time Interrogation Session: 20210303020032
Implantable Lead Implant Date: 19910923
Implantable Lead Implant Date: 19910923
Implantable Lead Location: 753859
Implantable Lead Location: 753860
Implantable Pulse Generator Implant Date: 20190123
Lead Channel Impedance Value: 240 Ohm
Lead Channel Impedance Value: 360 Ohm
Lead Channel Pacing Threshold Amplitude: 0.875 V
Lead Channel Pacing Threshold Amplitude: 1 V
Lead Channel Pacing Threshold Pulse Width: 0.5 ms
Lead Channel Pacing Threshold Pulse Width: 0.5 ms
Lead Channel Sensing Intrinsic Amplitude: 1.6 mV
Lead Channel Setting Pacing Amplitude: 1.125
Lead Channel Setting Pacing Amplitude: 2.5 V
Lead Channel Setting Pacing Pulse Width: 0.5 ms
Lead Channel Setting Sensing Sensitivity: 5 mV
Pulse Gen Model: 2272
Pulse Gen Serial Number: 8980383

## 2019-05-21 NOTE — Progress Notes (Signed)
PPM Remote  

## 2019-08-20 ENCOUNTER — Ambulatory Visit (INDEPENDENT_AMBULATORY_CARE_PROVIDER_SITE_OTHER): Payer: Medicare HMO | Admitting: *Deleted

## 2019-08-20 DIAGNOSIS — I442 Atrioventricular block, complete: Secondary | ICD-10-CM

## 2019-08-21 LAB — CUP PACEART REMOTE DEVICE CHECK
Battery Remaining Longevity: 106 mo
Battery Remaining Percentage: 95.5 %
Battery Voltage: 3.01 V
Brady Statistic AP VP Percent: 23 %
Brady Statistic AP VS Percent: 1 %
Brady Statistic AS VP Percent: 77 %
Brady Statistic AS VS Percent: 1 %
Brady Statistic RA Percent Paced: 23 %
Brady Statistic RV Percent Paced: 99 %
Date Time Interrogation Session: 20210603010304
Implantable Lead Implant Date: 19910923
Implantable Lead Implant Date: 19910923
Implantable Lead Location: 753859
Implantable Lead Location: 753860
Implantable Pulse Generator Implant Date: 20190123
Lead Channel Impedance Value: 240 Ohm
Lead Channel Impedance Value: 360 Ohm
Lead Channel Pacing Threshold Amplitude: 0.875 V
Lead Channel Pacing Threshold Amplitude: 1 V
Lead Channel Pacing Threshold Pulse Width: 0.5 ms
Lead Channel Pacing Threshold Pulse Width: 0.5 ms
Lead Channel Sensing Intrinsic Amplitude: 1.8 mV
Lead Channel Setting Pacing Amplitude: 1.125
Lead Channel Setting Pacing Amplitude: 2.5 V
Lead Channel Setting Pacing Pulse Width: 0.5 ms
Lead Channel Setting Sensing Sensitivity: 5 mV
Pulse Gen Model: 2272
Pulse Gen Serial Number: 8980383

## 2019-08-25 NOTE — Progress Notes (Signed)
Remote pacemaker transmission.   

## 2019-09-30 ENCOUNTER — Other Ambulatory Visit: Payer: Self-pay | Admitting: Cardiovascular Disease

## 2019-10-02 ENCOUNTER — Other Ambulatory Visit: Payer: Self-pay | Admitting: Cardiovascular Disease

## 2019-10-02 ENCOUNTER — Telehealth: Payer: Self-pay | Admitting: Cardiovascular Disease

## 2019-10-02 NOTE — Telephone Encounter (Signed)
Pt  Called and I believe needed refill but stated would call another office and would call back if needed ./cy

## 2019-10-02 NOTE — Telephone Encounter (Signed)
Patient called to speak with a nurse. Patient did not respond when I asked what it was regarding. Please call.

## 2019-10-02 NOTE — Telephone Encounter (Signed)
*  STAT* If patient is at the pharmacy, call can be transferred to refill team.   1. Which medications need to be refilled? (please list name of each medication and dose if known) Pt said she need all her medicine- she could not name them  2. Which pharmacy/location (including street and city if local pharmacy) is medication to be sent to? [facility]  3. Do they need a 30 day or 90 day supply? 90 days and refills

## 2019-10-06 ENCOUNTER — Encounter: Payer: Medicare HMO | Admitting: Cardiovascular Disease

## 2019-10-10 ENCOUNTER — Telehealth: Payer: Self-pay | Admitting: Podiatry

## 2019-10-10 NOTE — Telephone Encounter (Signed)
Pt called stating that she had some foot swelling and red streaks up her leg she coulndt get here to day told pt that she needs to go to ED if she has red streaks up her legs pt stated she would go if no better

## 2019-10-13 DIAGNOSIS — L309 Dermatitis, unspecified: Secondary | ICD-10-CM | POA: Diagnosis not present

## 2019-10-21 ENCOUNTER — Other Ambulatory Visit: Payer: Self-pay

## 2019-10-21 ENCOUNTER — Encounter: Payer: Self-pay | Admitting: Podiatry

## 2019-10-21 ENCOUNTER — Ambulatory Visit: Payer: Medicare HMO | Admitting: Podiatry

## 2019-10-21 VITALS — Temp 96.8°F

## 2019-10-21 DIAGNOSIS — L309 Dermatitis, unspecified: Secondary | ICD-10-CM | POA: Diagnosis not present

## 2019-10-21 DIAGNOSIS — H903 Sensorineural hearing loss, bilateral: Secondary | ICD-10-CM | POA: Insufficient documentation

## 2019-10-21 DIAGNOSIS — M19071 Primary osteoarthritis, right ankle and foot: Secondary | ICD-10-CM

## 2019-10-21 DIAGNOSIS — M21611 Bunion of right foot: Secondary | ICD-10-CM | POA: Diagnosis not present

## 2019-10-21 DIAGNOSIS — M205X1 Other deformities of toe(s) (acquired), right foot: Secondary | ICD-10-CM

## 2019-10-21 DIAGNOSIS — M2011 Hallux valgus (acquired), right foot: Secondary | ICD-10-CM | POA: Diagnosis not present

## 2019-10-21 NOTE — Progress Notes (Signed)
  Subjective:  Patient ID: Abigail Melendez, female    DOB: May 01, 1920,  MRN: [id number]  Chief Complaint  Patient presents with  . Foot Pain    Right foot; dorsal and around bunion area; redness; pt stated, "Taking Tramcinolone Cream 1% and that is helping the redness; said there was redness on the left foot; please check; has had pain in left foot"    86 y.o. female presents with the above complaint. History confirmed with patient.  Here today with her son.  Her PCP prescribed the triamcinolone which has been helpful.  It is not helping with the pain on the inside of the bunion.  Objective:  Physical Exam: warm, good capillary refill, no trophic changes or ulcerative lesions, palpable DP and PT pulses, normal sensory exam and varicosities noted throughout both lower extremities. Left Foot: 1 small area dorsal forefoot of the same erythematous macular rash Right Foot: Erythematous macular rash present over the dorsal metatarsal phalangeal area, pruritic.  There is also a severe bunion with medial prominence which is painful to touch.  First MTPJ range of motion is limited with painful range of motion.  Assessment:   1. Dermatitis of right foot   2. Hallux valgus with bunions, right   3. Osteoarthritis of first metatarsophalangeal (MTP) joint of right foot   4. Hallux limitus, right      Plan:  Patient was evaluated and treated and all questions answered.  -The rash appears to be a separate issue and are resolving with triamcinolone cream.  Advised if a need a refill of this I can prescribe it for them.  Continue application of the right foot and begin application on the small area left foot as well  -Her right foot pain appears to also be related to a bunion on the medial side with bursal formation and an arthritic joint.  Advised that they can use Voltaren for as needed pain relief at home and a steroid injection in the joint today can help alleviate your pain and  inflammation  Procedure: Joint Injection Location: Right first MTP joint Skin Prep: Betadine. Injectate: 0.5 cc 1% lidocaine plain, 0.5 cc dexamethasone phosphate, 0.5 cc Kenalog 10. Disposition: Patient tolerated procedure well. Injection site dressed with a band-aid.   Return in about 2 months (around 12/21/2019) for recheck arthritis and rash.

## 2019-10-21 NOTE — Patient Instructions (Signed)
Look for voltaren gel (also known as diclofenac) at the pharmacy or [facility]. Apply to the painful area 3-4x daily

## 2019-10-29 DIAGNOSIS — I130 Hypertensive heart and chronic kidney disease with heart failure and stage 1 through stage 4 chronic kidney disease, or unspecified chronic kidney disease: Secondary | ICD-10-CM | POA: Diagnosis not present

## 2019-10-29 DIAGNOSIS — E038 Other specified hypothyroidism: Secondary | ICD-10-CM | POA: Diagnosis not present

## 2019-10-29 DIAGNOSIS — E559 Vitamin D deficiency, unspecified: Secondary | ICD-10-CM | POA: Diagnosis not present

## 2019-10-29 DIAGNOSIS — E7800 Pure hypercholesterolemia, unspecified: Secondary | ICD-10-CM | POA: Diagnosis not present

## 2019-10-29 DIAGNOSIS — Z0000 Encounter for general adult medical examination without abnormal findings: Secondary | ICD-10-CM | POA: Diagnosis not present

## 2019-11-19 ENCOUNTER — Ambulatory Visit (INDEPENDENT_AMBULATORY_CARE_PROVIDER_SITE_OTHER): Payer: Medicare HMO | Admitting: *Deleted

## 2019-11-19 DIAGNOSIS — I442 Atrioventricular block, complete: Secondary | ICD-10-CM

## 2019-11-19 LAB — CUP PACEART REMOTE DEVICE CHECK
Battery Remaining Longevity: 105 mo
Battery Remaining Percentage: 95.5 %
Battery Voltage: 3.01 V
Brady Statistic AP VP Percent: 23 %
Brady Statistic AP VS Percent: 1 %
Brady Statistic AS VP Percent: 77 %
Brady Statistic AS VS Percent: 1 %
Brady Statistic RA Percent Paced: 23 %
Brady Statistic RV Percent Paced: 99 %
Date Time Interrogation Session: 20210901020015
Implantable Lead Implant Date: 19910923
Implantable Lead Implant Date: 19910923
Implantable Lead Location: 753859
Implantable Lead Location: 753860
Implantable Pulse Generator Implant Date: 20190123
Lead Channel Impedance Value: 240 Ohm
Lead Channel Impedance Value: 360 Ohm
Lead Channel Pacing Threshold Amplitude: 0.875 V
Lead Channel Pacing Threshold Amplitude: 1 V
Lead Channel Pacing Threshold Pulse Width: 0.5 ms
Lead Channel Pacing Threshold Pulse Width: 0.5 ms
Lead Channel Sensing Intrinsic Amplitude: 1.6 mV
Lead Channel Setting Pacing Amplitude: 1.125
Lead Channel Setting Pacing Amplitude: 2.5 V
Lead Channel Setting Pacing Pulse Width: 0.5 ms
Lead Channel Setting Sensing Sensitivity: 5 mV
Pulse Gen Model: 2272
Pulse Gen Serial Number: 8980383

## 2019-11-21 NOTE — Progress Notes (Signed)
Remote pacemaker transmission.   

## 2019-12-01 DIAGNOSIS — I679 Cerebrovascular disease, unspecified: Secondary | ICD-10-CM | POA: Diagnosis not present

## 2019-12-01 DIAGNOSIS — N1831 Chronic kidney disease, stage 3a: Secondary | ICD-10-CM | POA: Diagnosis not present

## 2019-12-01 DIAGNOSIS — E039 Hypothyroidism, unspecified: Secondary | ICD-10-CM | POA: Diagnosis not present

## 2019-12-01 DIAGNOSIS — R82998 Other abnormal findings in urine: Secondary | ICD-10-CM | POA: Diagnosis not present

## 2019-12-01 DIAGNOSIS — D692 Other nonthrombocytopenic purpura: Secondary | ICD-10-CM | POA: Diagnosis not present

## 2019-12-01 DIAGNOSIS — Z23 Encounter for immunization: Secondary | ICD-10-CM | POA: Diagnosis not present

## 2019-12-01 DIAGNOSIS — E46 Unspecified protein-calorie malnutrition: Secondary | ICD-10-CM | POA: Diagnosis not present

## 2019-12-01 DIAGNOSIS — I5032 Chronic diastolic (congestive) heart failure: Secondary | ICD-10-CM | POA: Diagnosis not present

## 2019-12-01 DIAGNOSIS — B353 Tinea pedis: Secondary | ICD-10-CM | POA: Diagnosis not present

## 2019-12-01 DIAGNOSIS — I422 Other hypertrophic cardiomyopathy: Secondary | ICD-10-CM | POA: Diagnosis not present

## 2019-12-01 DIAGNOSIS — I8310 Varicose veins of unspecified lower extremity with inflammation: Secondary | ICD-10-CM | POA: Diagnosis not present

## 2019-12-01 DIAGNOSIS — Z0000 Encounter for general adult medical examination without abnormal findings: Secondary | ICD-10-CM | POA: Diagnosis not present

## 2019-12-01 DIAGNOSIS — I130 Hypertensive heart and chronic kidney disease with heart failure and stage 1 through stage 4 chronic kidney disease, or unspecified chronic kidney disease: Secondary | ICD-10-CM | POA: Diagnosis not present

## 2019-12-25 ENCOUNTER — Ambulatory Visit: Payer: Medicare HMO | Admitting: Podiatry

## 2019-12-29 ENCOUNTER — Other Ambulatory Visit: Payer: Self-pay

## 2019-12-29 ENCOUNTER — Encounter: Payer: Self-pay | Admitting: Cardiovascular Disease

## 2019-12-29 ENCOUNTER — Ambulatory Visit (INDEPENDENT_AMBULATORY_CARE_PROVIDER_SITE_OTHER): Payer: Medicare HMO | Admitting: Cardiovascular Disease

## 2019-12-29 VITALS — BP 123/49 | HR 75 | Wt 121.2 lb

## 2019-12-29 DIAGNOSIS — I10 Essential (primary) hypertension: Secondary | ICD-10-CM | POA: Diagnosis not present

## 2019-12-29 DIAGNOSIS — I442 Atrioventricular block, complete: Secondary | ICD-10-CM

## 2019-12-29 DIAGNOSIS — Z950 Presence of cardiac pacemaker: Secondary | ICD-10-CM | POA: Diagnosis not present

## 2019-12-29 DIAGNOSIS — I5032 Chronic diastolic (congestive) heart failure: Secondary | ICD-10-CM | POA: Diagnosis not present

## 2019-12-29 DIAGNOSIS — E7800 Pure hypercholesterolemia, unspecified: Secondary | ICD-10-CM | POA: Diagnosis not present

## 2019-12-29 NOTE — Progress Notes (Signed)
Cardiology office note    Date:  12/30/2019   ID:  Abigail, Melendez Mar 27, 1920, MRN [id number]  PCP:  Gaspar Garbe, MD  Cardiologist:  Lukka Black Electrophysiologist:  None   Evaluation Performed:  Follow-Up Visit  Chief Complaint:   CHB, pacemaker, CHF  History of Present Illness:    Abigail Melendez is a 86 y.o. female with complete heart block, dual-chamber permanent pacemaker (St Jude Assurity, initial implant unipolar leads from 1991, most recent generator change January 2019), chronic diastolic heart failure, mild mixed hyperlipidemia, essential hypertension.  She continues to live in her own home, with assistance from her son who lives close by.  She takes care of light household chores without assistance.  She has no cardiovascular complaints but is preoccupied by some superficial lesions on her feet.  She seen a couple of specialists who have recommended creams, but this has not helped.  The patient specifically denies any chest pain at rest exertion, dyspnea at rest or with exertion, orthopnea, paroxysmal nocturnal dyspnea, syncope, palpitations, focal neurological deficits, intermittent claudication, lower extremity edema, unexplained weight gain, cough, hemoptysis or wheezing.  Her pacemaker check performed in the office today shows normal device function.  The current generator, implanted in 2019 still has 8-9.4 years of estimated longevity.  She has 84 year old unipolar leads and has episodes of oversensing/noise on the atrial lead that can be easily reproduced with isometric exercises.  The sensed P waves are only 1.0-1.5 mV so it is not possible to completely cover up all the "noise".  Therefore she has frequent but very brief episodes of atrial mode switch (thousands of episodes are recorded, but the longest 1 is only 12 seconds for an overall burden of less than 1%).  She has 24% atrial pacing and 100% ventricular pacing.  She has not had any episodes of high  ventricular rates.  Her most recent echocardiogram was performed in 2013 and showed normal left ventricular systolic function, moderate LVH with abnormal relaxation and no serious valvular abnormalities.  She had a cardiac catheterization with normal vessels in 1988, normal nuclear stress test in 2007 and has not really had any reason for repeat evaluation since then.  Past Medical History:  Diagnosis Date  . Anxiety   . CHB (complete heart block) (HCC) 01/22/2013   Pacemaker dependent   . CHF (congestive heart failure) (HCC)    a. echo in 2013 showed a preserved EF of greater than 55% with no significant valve abnormalities.   . Complete heart block [facility]) May 18, 1998   pacemaker implanted  . Edema leg 10-12-1997 lower extremity venous study   pain and edema x 1 week . no dvt,large non-vascular mass located at the level of the lt. medial knee and extends to the proximal calf  . Hyperlipidemia   . Hypertension   . Hypertonicity of bladder   . Hypothyroidism   . Murmur 09-05-2011 adult echocardiogram   EF 55% mild abnormalities; no change in therapy   . Osteoporosis   . Pacemaker 05/18/1998   St.Jude affinityDR modle#5330 serial#[id number]  . Sick sinus syndrome (HCC)    a. normal cardiolite myocardial perfusion study in 2007.   Marland Kitchen Syncope, near 05-13-2003 corotid doppler   mild heterogeneous plaque noted at origins of ica andeca no evidence of significant ica stenosis . vertebral artery flow is antegrade   Past Surgical History:  Procedure Laterality Date  . CARDIAC CATHETERIZATION  11-25-1986   lt. heart cath for exertional cp and  syncope;coronaries normal except very minor 20% lesion in lad possible coronary spasm  . PACEMAKER PLACEMENT  inital implant 12-10-1989   symtomatic heart block ; non rate responsive pacemaker paragon II generator  . PACEMAKER PLACEMENT  05-18-1998   new DDDR bipolar autocapture,mode switching pacesetter affinity DR pulse generator model number 5330r; serial #  [id number]  . PACEMAKER PLACEMENT  02-13-2006   st. Tomi Likens DR, model [id number], serial [id number]  . PPM GENERATOR CHANGEOUT N/A 04/11/2017   Procedure: PPM GENERATOR CHANGEOUT;  Surgeon: Thurmon Fair, MD;  Location: [facility] INVASIVE CV LAB;  Service: Cardiovascular;  Laterality: N/A;     No outpatient medications have been marked as taking for the 12/29/19 encounter (Office Visit) with Thurmon Fair, MD.     Allergies:   Codeine   Social History   Tobacco Use  . Smoking status: Former Games developer  . Smokeless tobacco: Never Used  Substance Use Topics  . Alcohol use: Not on file  . Drug use: Not on file     Family Hx: The patient's family history is not on file.  ROS:   Please see the history of present illness.   All other systems are reviewed and are negative.   Prior CV studies:   The following studies were reviewed today: Pacemaker download August 21, 2018  Labs/Other Tests and Data Reviewed:    EKG: Ordered today shows atrial sensed, ventricular paced rhythm with very broad paced QRS at 184 ms and appropriately prolonged QTC 542 ms Recent Labs: October 29, 2019 labs from Dr. Wylene Simmer Creatinine 0.9 ,  hemoglobin 11.5, normal liver function tests TSH 1.07  Recent Lipid Panel Lab Results  Component Value Date/Time   CHOL 158 08/24/2015 11:49 AM   TRIG 174 (H) 08/24/2015 11:49 AM   HDL 52 08/24/2015 11:49 AM   CHOLHDL 3.0 08/24/2015 11:49 AM   LDLCALC 71 08/24/2015 11:49 AM  October 29, 2019 Total cholesterol 120, triglycerides 103, HDL 42, LDL 57  Wt Readings from Last 3 Encounters:  12/29/19 121 lb 3.2 oz (55 kg)  10/11/18 136 lb (61.7 kg)  08/16/17 133 lb 12.8 oz (60.7 kg)     Objective:    Vital Signs:  BP (!) 123/49   Pulse 75   Wt 121 lb 3.2 oz (55 kg)   SpO2 97%   BMI 22.17 kg/m     General: Alert, oriented x3, no distress, elderly, but does not appear frail.  Healthy subclavian pacemaker site Head: no evidence of trauma, PERRL, EOMI, no exophtalmos or  lid lag, no myxedema, no xanthelasma; normal ears, nose and oropharynx Neck: normal jugular venous pulsations and no hepatojugular reflux; brisk carotid pulses without delay and no carotid bruits Chest: clear to auscultation, no signs of consolidation by percussion or palpation, normal fremitus, symmetrical and full respiratory excursions Cardiovascular: normal position and quality of the apical impulse, regular rhythm, normal first and paradoxically split second heart sounds, no murmurs, rubs or gallops Abdomen: no tenderness or distention, no masses by palpation, no abnormal pulsatility or arterial bruits, normal bowel sounds, no hepatosplenomegaly Extremities: no clubbing, cyanosis or edema; 2+ radial, ulnar and brachial pulses bilaterally; 2+ right femoral, posterior tibial and dorsalis pedis pulses; 2+ left femoral, posterior tibial and dorsalis pedis pulses; no subclavian or femoral bruits Neurological: grossly nonfocal Psych: Normal mood and affect   ASSESSMENT & PLAN:    1. CHB (complete heart block) (HCC)   2. Pacemaker   3. Chronic diastolic heart failure (HCC)   4.  Essential hypertension   5. Pure hypercholesterolemia      1. CHB: Pacemaker dependent. 2. PM: Atrial oversensing reproducible with isometric exercises, causing frequent episodes of very brief atrial mode switch, not really interfering with normal device function.  Continue remote downloads every 3 months and yearly office visit. 3. CHF: Appears clinically euvolemic without the need for diuretics.  Echo showed evidence of diastolic dysfunction. 4.  HLP: All lipid parameters in desirable range on current medications. 5.  HTN: Normal systolic blood pressure, rather low diastolic blood pressure, thankfully she is asymptomatic but we may need to decrease some of her antihypertensive medications.  I think she is currently on losartan and verapamil and the previous prescription for trandolapril/verapamil is no longer active.   Will check with PCP.  Patient Instructions  Medication Instructions:  No changes *If you need a refill on your cardiac medications before your next appointment, please call your pharmacy*   Lab Work: None ordered If you have labs (blood work) drawn today and your tests are completely normal, you will receive your results only by: Marland Kitchen MyChart Message (if you have MyChart) OR . A paper copy in the mail If you have any lab test that is abnormal or we need to change your treatment, we will call you to review the results.   Testing/Procedures: None ordered   Follow-Up: At [facility], you and your health needs are our priority.  As part of our continuing mission to provide you with exceptional heart care, we have created designated Provider Care Teams.  These Care Teams include your primary Cardiologist (physician) and Advanced Practice Providers (APPs -  Physician Assistants and Nurse Practitioners) who all work together to provide you with the care you need, when you need it.  We recommend signing up for the patient portal called "MyChart".  Sign up information is provided on this After Visit Summary.  MyChart is used to connect with patients for Virtual Visits (Telemedicine).  Patients are able to view lab/test results, encounter notes, upcoming appointments, etc.  Non-urgent messages can be sent to your provider as well.   To learn more about what you can do with MyChart, go to [email or web address].    Your next appointment:   12 month(s)  The format for your next appointment:   In Person  Provider:   Thurmon Fair, MD      Signed, Thurmon Fair, MD  12/30/2019 10:07 AM    [facility]

## 2019-12-29 NOTE — Patient Instructions (Signed)

## 2020-01-05 DIAGNOSIS — H353131 Nonexudative age-related macular degeneration, bilateral, early dry stage: Secondary | ICD-10-CM | POA: Diagnosis not present

## 2020-02-18 ENCOUNTER — Ambulatory Visit (INDEPENDENT_AMBULATORY_CARE_PROVIDER_SITE_OTHER): Payer: Medicare HMO

## 2020-02-18 DIAGNOSIS — I442 Atrioventricular block, complete: Secondary | ICD-10-CM | POA: Diagnosis not present

## 2020-02-18 LAB — CUP PACEART REMOTE DEVICE CHECK
Battery Remaining Longevity: 104 mo
Battery Remaining Percentage: 95.5 %
Battery Voltage: 2.99 V
Brady Statistic AP VP Percent: 25 %
Brady Statistic AP VS Percent: 1 %
Brady Statistic AS VP Percent: 75 %
Brady Statistic AS VS Percent: 1 %
Brady Statistic RA Percent Paced: 24 %
Brady Statistic RV Percent Paced: 99 %
Date Time Interrogation Session: 20211201020025
Implantable Lead Implant Date: 19910923
Implantable Lead Implant Date: 19910923
Implantable Lead Location: 753859
Implantable Lead Location: 753860
Implantable Pulse Generator Implant Date: 20190123
Lead Channel Impedance Value: 240 Ohm
Lead Channel Impedance Value: 350 Ohm
Lead Channel Pacing Threshold Amplitude: 0.875 V
Lead Channel Pacing Threshold Amplitude: 1 V
Lead Channel Pacing Threshold Pulse Width: 0.5 ms
Lead Channel Pacing Threshold Pulse Width: 0.5 ms
Lead Channel Sensing Intrinsic Amplitude: 1.1 mV
Lead Channel Setting Pacing Amplitude: 1.125
Lead Channel Setting Pacing Amplitude: 2.5 V
Lead Channel Setting Pacing Pulse Width: 0.5 ms
Lead Channel Setting Sensing Sensitivity: 5 mV
Pulse Gen Model: 2272
Pulse Gen Serial Number: 8980383

## 2020-02-25 NOTE — Progress Notes (Signed)
Remote pacemaker transmission.   

## 2020-04-21 ENCOUNTER — Telehealth: Payer: Self-pay | Admitting: Cardiovascular Disease

## 2020-04-21 MED ORDER — VERAPAMIL HCL ER 240 MG PO TBCR: 240.0000 mg | EXTENDED_RELEASE_TABLET | Freq: Every day | ORAL | 1 refills | Status: AC

## 2020-04-21 MED ORDER — PRAVASTATIN SODIUM 40 MG PO TABS: 40.0000 mg | ORAL_TABLET | Freq: Every day | ORAL | 1 refills | Status: AC

## 2020-04-21 NOTE — Telephone Encounter (Signed)
*  STAT* If patient is at the pharmacy, call can be transferred to refill team.   1. Which medications need to be refilled? (please list name of each medication and dose if known) verapamil (CALAN-SR) 240 MG CR tablet pravastatin (PRAVACHOL) 40 MG tablet  2. Which pharmacy/location (including street and city if local pharmacy) is medication to be sent to? [facility] - [place], [place] - [address]  3. Do they need a 30 day or 90 day supply? 90 day supply

## 2020-05-19 ENCOUNTER — Ambulatory Visit (INDEPENDENT_AMBULATORY_CARE_PROVIDER_SITE_OTHER): Payer: Medicare HMO

## 2020-05-19 DIAGNOSIS — I442 Atrioventricular block, complete: Secondary | ICD-10-CM | POA: Diagnosis not present

## 2020-05-21 LAB — CUP PACEART REMOTE DEVICE CHECK
Battery Remaining Longevity: 106 mo
Battery Remaining Percentage: 95.5 %
Battery Voltage: 2.99 V
Brady Statistic AP VP Percent: 26 %
Brady Statistic AP VS Percent: 1 %
Brady Statistic AS VP Percent: 74 %
Brady Statistic AS VS Percent: 1 %
Brady Statistic RA Percent Paced: 25 %
Brady Statistic RV Percent Paced: 99 %
Date Time Interrogation Session: 20220302020014
Implantable Lead Implant Date: 19910923
Implantable Lead Implant Date: 19910923
Implantable Lead Location: 753859
Implantable Lead Location: 753860
Implantable Pulse Generator Implant Date: 20190123
Lead Channel Impedance Value: 240 Ohm
Lead Channel Impedance Value: 360 Ohm
Lead Channel Pacing Threshold Amplitude: 0.875 V
Lead Channel Pacing Threshold Amplitude: 1 V
Lead Channel Pacing Threshold Pulse Width: 0.5 ms
Lead Channel Pacing Threshold Pulse Width: 0.5 ms
Lead Channel Sensing Intrinsic Amplitude: 1.8 mV
Lead Channel Setting Pacing Amplitude: 1.125
Lead Channel Setting Pacing Amplitude: 2.5 V
Lead Channel Setting Pacing Pulse Width: 0.5 ms
Lead Channel Setting Sensing Sensitivity: 5 mV
Pulse Gen Model: 2272
Pulse Gen Serial Number: 8980383

## 2020-05-28 NOTE — Progress Notes (Signed)
Remote pacemaker transmission.   

## 2020-05-31 DIAGNOSIS — E46 Unspecified protein-calorie malnutrition: Secondary | ICD-10-CM | POA: Diagnosis not present

## 2020-05-31 DIAGNOSIS — E039 Hypothyroidism, unspecified: Secondary | ICD-10-CM | POA: Diagnosis not present

## 2020-05-31 DIAGNOSIS — I679 Cerebrovascular disease, unspecified: Secondary | ICD-10-CM | POA: Diagnosis not present

## 2020-05-31 DIAGNOSIS — I130 Hypertensive heart and chronic kidney disease with heart failure and stage 1 through stage 4 chronic kidney disease, or unspecified chronic kidney disease: Secondary | ICD-10-CM | POA: Diagnosis not present

## 2020-05-31 DIAGNOSIS — E7800 Pure hypercholesterolemia, unspecified: Secondary | ICD-10-CM | POA: Diagnosis not present

## 2020-05-31 DIAGNOSIS — R300 Dysuria: Secondary | ICD-10-CM | POA: Diagnosis not present

## 2020-05-31 DIAGNOSIS — D692 Other nonthrombocytopenic purpura: Secondary | ICD-10-CM | POA: Diagnosis not present

## 2020-05-31 DIAGNOSIS — N1831 Chronic kidney disease, stage 3a: Secondary | ICD-10-CM | POA: Diagnosis not present

## 2020-05-31 DIAGNOSIS — H9190 Unspecified hearing loss, unspecified ear: Secondary | ICD-10-CM | POA: Diagnosis not present

## 2020-05-31 DIAGNOSIS — I5032 Chronic diastolic (congestive) heart failure: Secondary | ICD-10-CM | POA: Diagnosis not present

## 2020-05-31 DIAGNOSIS — N318 Other neuromuscular dysfunction of bladder: Secondary | ICD-10-CM | POA: Diagnosis not present

## 2020-05-31 DIAGNOSIS — I422 Other hypertrophic cardiomyopathy: Secondary | ICD-10-CM | POA: Diagnosis not present

## 2020-05-31 DIAGNOSIS — Z950 Presence of cardiac pacemaker: Secondary | ICD-10-CM | POA: Diagnosis not present

## 2020-06-05 DIAGNOSIS — R531 Weakness: Secondary | ICD-10-CM | POA: Diagnosis not present

## 2020-06-05 DIAGNOSIS — R197 Diarrhea, unspecified: Secondary | ICD-10-CM | POA: Diagnosis not present

## 2020-06-05 DIAGNOSIS — N171 Acute kidney failure with acute cortical necrosis: Secondary | ICD-10-CM | POA: Diagnosis not present

## 2020-06-05 DIAGNOSIS — A419 Sepsis, unspecified organism: Secondary | ICD-10-CM | POA: Diagnosis not present

## 2020-06-05 DIAGNOSIS — R109 Unspecified abdominal pain: Secondary | ICD-10-CM | POA: Diagnosis not present

## 2020-06-05 DIAGNOSIS — R1084 Generalized abdominal pain: Secondary | ICD-10-CM | POA: Diagnosis not present

## 2020-06-05 DIAGNOSIS — E860 Dehydration: Secondary | ICD-10-CM | POA: Diagnosis not present

## 2020-06-05 DIAGNOSIS — N179 Acute kidney failure, unspecified: Secondary | ICD-10-CM | POA: Diagnosis not present

## 2020-06-05 DIAGNOSIS — R6520 Severe sepsis without septic shock: Secondary | ICD-10-CM | POA: Diagnosis not present

## 2020-06-05 DIAGNOSIS — I5033 Acute on chronic diastolic (congestive) heart failure: Secondary | ICD-10-CM | POA: Diagnosis not present

## 2020-06-05 DIAGNOSIS — A414 Sepsis due to anaerobes: Secondary | ICD-10-CM | POA: Diagnosis not present

## 2020-06-05 DIAGNOSIS — Z7401 Bed confinement status: Secondary | ICD-10-CM | POA: Diagnosis not present

## 2020-06-05 DIAGNOSIS — I442 Atrioventricular block, complete: Secondary | ICD-10-CM | POA: Diagnosis not present

## 2020-06-05 DIAGNOSIS — E162 Hypoglycemia, unspecified: Secondary | ICD-10-CM | POA: Diagnosis not present

## 2020-06-05 DIAGNOSIS — E785 Hyperlipidemia, unspecified: Secondary | ICD-10-CM | POA: Diagnosis not present

## 2020-06-05 DIAGNOSIS — A0472 Enterocolitis due to Clostridium difficile, not specified as recurrent: Secondary | ICD-10-CM | POA: Diagnosis not present

## 2020-06-05 DIAGNOSIS — I5032 Chronic diastolic (congestive) heart failure: Secondary | ICD-10-CM | POA: Diagnosis not present

## 2020-06-05 DIAGNOSIS — E039 Hypothyroidism, unspecified: Secondary | ICD-10-CM | POA: Diagnosis not present

## 2020-06-05 DIAGNOSIS — M2550 Pain in unspecified joint: Secondary | ICD-10-CM | POA: Diagnosis not present

## 2020-06-05 DIAGNOSIS — I495 Sick sinus syndrome: Secondary | ICD-10-CM | POA: Diagnosis not present

## 2020-06-05 DIAGNOSIS — A09 Infectious gastroenteritis and colitis, unspecified: Secondary | ICD-10-CM | POA: Diagnosis not present

## 2020-06-05 DIAGNOSIS — D7289 Other specified disorders of white blood cells: Secondary | ICD-10-CM | POA: Diagnosis not present

## 2020-06-06 DIAGNOSIS — A0472 Enterocolitis due to Clostridium difficile, not specified as recurrent: Secondary | ICD-10-CM | POA: Diagnosis not present

## 2020-06-07 DIAGNOSIS — N171 Acute kidney failure with acute cortical necrosis: Secondary | ICD-10-CM | POA: Diagnosis not present

## 2020-06-07 DIAGNOSIS — A414 Sepsis due to anaerobes: Secondary | ICD-10-CM | POA: Diagnosis not present

## 2020-06-07 DIAGNOSIS — R6520 Severe sepsis without septic shock: Secondary | ICD-10-CM | POA: Diagnosis not present

## 2020-06-07 DIAGNOSIS — E039 Hypothyroidism, unspecified: Secondary | ICD-10-CM | POA: Diagnosis not present

## 2020-06-07 DIAGNOSIS — A0472 Enterocolitis due to Clostridium difficile, not specified as recurrent: Secondary | ICD-10-CM | POA: Diagnosis not present

## 2020-06-07 DIAGNOSIS — E785 Hyperlipidemia, unspecified: Secondary | ICD-10-CM | POA: Diagnosis not present

## 2020-06-08 DIAGNOSIS — E039 Hypothyroidism, unspecified: Secondary | ICD-10-CM | POA: Diagnosis not present

## 2020-06-08 DIAGNOSIS — R6520 Severe sepsis without septic shock: Secondary | ICD-10-CM | POA: Diagnosis not present

## 2020-06-08 DIAGNOSIS — A0472 Enterocolitis due to Clostridium difficile, not specified as recurrent: Secondary | ICD-10-CM | POA: Diagnosis not present

## 2020-06-08 DIAGNOSIS — A414 Sepsis due to anaerobes: Secondary | ICD-10-CM | POA: Diagnosis not present

## 2020-06-08 DIAGNOSIS — E785 Hyperlipidemia, unspecified: Secondary | ICD-10-CM | POA: Diagnosis not present

## 2020-06-08 DIAGNOSIS — N171 Acute kidney failure with acute cortical necrosis: Secondary | ICD-10-CM | POA: Diagnosis not present

## 2020-06-09 DIAGNOSIS — R6520 Severe sepsis without septic shock: Secondary | ICD-10-CM | POA: Diagnosis not present

## 2020-06-09 DIAGNOSIS — E039 Hypothyroidism, unspecified: Secondary | ICD-10-CM | POA: Diagnosis not present

## 2020-06-09 DIAGNOSIS — E785 Hyperlipidemia, unspecified: Secondary | ICD-10-CM | POA: Diagnosis not present

## 2020-06-09 DIAGNOSIS — A0472 Enterocolitis due to Clostridium difficile, not specified as recurrent: Secondary | ICD-10-CM | POA: Diagnosis not present

## 2020-06-09 DIAGNOSIS — N171 Acute kidney failure with acute cortical necrosis: Secondary | ICD-10-CM | POA: Diagnosis not present

## 2020-06-09 DIAGNOSIS — A414 Sepsis due to anaerobes: Secondary | ICD-10-CM | POA: Diagnosis not present

## 2020-06-10 DIAGNOSIS — A419 Sepsis, unspecified organism: Secondary | ICD-10-CM | POA: Diagnosis not present

## 2020-06-10 DIAGNOSIS — Z950 Presence of cardiac pacemaker: Secondary | ICD-10-CM | POA: Diagnosis not present

## 2020-06-10 DIAGNOSIS — M2550 Pain in unspecified joint: Secondary | ICD-10-CM | POA: Diagnosis not present

## 2020-06-10 DIAGNOSIS — N179 Acute kidney failure, unspecified: Secondary | ICD-10-CM | POA: Diagnosis not present

## 2020-06-10 DIAGNOSIS — R531 Weakness: Secondary | ICD-10-CM | POA: Diagnosis not present

## 2020-06-10 DIAGNOSIS — I10 Essential (primary) hypertension: Secondary | ICD-10-CM | POA: Diagnosis not present

## 2020-06-10 DIAGNOSIS — R918 Other nonspecific abnormal finding of lung field: Secondary | ICD-10-CM | POA: Diagnosis not present

## 2020-06-10 DIAGNOSIS — I495 Sick sinus syndrome: Secondary | ICD-10-CM | POA: Diagnosis not present

## 2020-06-10 DIAGNOSIS — R6520 Severe sepsis without septic shock: Secondary | ICD-10-CM | POA: Diagnosis not present

## 2020-06-10 DIAGNOSIS — I5032 Chronic diastolic (congestive) heart failure: Secondary | ICD-10-CM | POA: Diagnosis not present

## 2020-06-10 DIAGNOSIS — D649 Anemia, unspecified: Secondary | ICD-10-CM | POA: Diagnosis not present

## 2020-06-10 DIAGNOSIS — R1084 Generalized abdominal pain: Secondary | ICD-10-CM | POA: Diagnosis not present

## 2020-06-10 DIAGNOSIS — A0472 Enterocolitis due to Clostridium difficile, not specified as recurrent: Secondary | ICD-10-CM | POA: Diagnosis not present

## 2020-06-10 DIAGNOSIS — R197 Diarrhea, unspecified: Secondary | ICD-10-CM | POA: Diagnosis not present

## 2020-06-10 DIAGNOSIS — Z7401 Bed confinement status: Secondary | ICD-10-CM | POA: Diagnosis not present

## 2020-06-10 DIAGNOSIS — R634 Abnormal weight loss: Secondary | ICD-10-CM | POA: Diagnosis not present

## 2020-06-10 DIAGNOSIS — I5033 Acute on chronic diastolic (congestive) heart failure: Secondary | ICD-10-CM | POA: Diagnosis not present

## 2020-06-10 DIAGNOSIS — E876 Hypokalemia: Secondary | ICD-10-CM | POA: Diagnosis not present

## 2020-06-10 DIAGNOSIS — E039 Hypothyroidism, unspecified: Secondary | ICD-10-CM | POA: Diagnosis not present

## 2020-06-10 DIAGNOSIS — E785 Hyperlipidemia, unspecified: Secondary | ICD-10-CM | POA: Diagnosis not present

## 2020-06-10 DIAGNOSIS — I509 Heart failure, unspecified: Secondary | ICD-10-CM | POA: Diagnosis not present

## 2020-06-10 DIAGNOSIS — N171 Acute kidney failure with acute cortical necrosis: Secondary | ICD-10-CM | POA: Diagnosis not present

## 2020-06-11 DIAGNOSIS — A0472 Enterocolitis due to Clostridium difficile, not specified as recurrent: Secondary | ICD-10-CM | POA: Diagnosis not present

## 2020-06-11 DIAGNOSIS — I495 Sick sinus syndrome: Secondary | ICD-10-CM | POA: Diagnosis not present

## 2020-06-11 DIAGNOSIS — I509 Heart failure, unspecified: Secondary | ICD-10-CM | POA: Diagnosis not present

## 2020-06-11 DIAGNOSIS — R918 Other nonspecific abnormal finding of lung field: Secondary | ICD-10-CM | POA: Diagnosis not present

## 2020-06-11 DIAGNOSIS — Z950 Presence of cardiac pacemaker: Secondary | ICD-10-CM | POA: Diagnosis not present

## 2020-06-11 DIAGNOSIS — D649 Anemia, unspecified: Secondary | ICD-10-CM | POA: Diagnosis not present

## 2020-06-11 DIAGNOSIS — I10 Essential (primary) hypertension: Secondary | ICD-10-CM | POA: Diagnosis not present

## 2020-06-11 DIAGNOSIS — E039 Hypothyroidism, unspecified: Secondary | ICD-10-CM | POA: Diagnosis not present

## 2020-06-20 DIAGNOSIS — I495 Sick sinus syndrome: Secondary | ICD-10-CM | POA: Diagnosis not present

## 2020-06-20 DIAGNOSIS — I10 Essential (primary) hypertension: Secondary | ICD-10-CM | POA: Diagnosis not present

## 2020-06-20 DIAGNOSIS — Z950 Presence of cardiac pacemaker: Secondary | ICD-10-CM | POA: Diagnosis not present

## 2020-06-20 DIAGNOSIS — I509 Heart failure, unspecified: Secondary | ICD-10-CM | POA: Diagnosis not present

## 2020-06-20 DIAGNOSIS — D649 Anemia, unspecified: Secondary | ICD-10-CM | POA: Diagnosis not present

## 2020-06-20 DIAGNOSIS — A0472 Enterocolitis due to Clostridium difficile, not specified as recurrent: Secondary | ICD-10-CM | POA: Diagnosis not present

## 2020-06-20 DIAGNOSIS — E039 Hypothyroidism, unspecified: Secondary | ICD-10-CM | POA: Diagnosis not present

## 2020-06-22 DIAGNOSIS — I509 Heart failure, unspecified: Secondary | ICD-10-CM | POA: Diagnosis not present

## 2020-06-22 DIAGNOSIS — I495 Sick sinus syndrome: Secondary | ICD-10-CM | POA: Diagnosis not present

## 2020-06-22 DIAGNOSIS — E039 Hypothyroidism, unspecified: Secondary | ICD-10-CM | POA: Diagnosis not present

## 2020-06-22 DIAGNOSIS — I10 Essential (primary) hypertension: Secondary | ICD-10-CM | POA: Diagnosis not present

## 2020-06-22 DIAGNOSIS — R634 Abnormal weight loss: Secondary | ICD-10-CM | POA: Diagnosis not present

## 2020-06-22 DIAGNOSIS — A0472 Enterocolitis due to Clostridium difficile, not specified as recurrent: Secondary | ICD-10-CM | POA: Diagnosis not present

## 2020-06-22 DIAGNOSIS — Z950 Presence of cardiac pacemaker: Secondary | ICD-10-CM | POA: Diagnosis not present

## 2020-06-22 DIAGNOSIS — D649 Anemia, unspecified: Secondary | ICD-10-CM | POA: Diagnosis not present

## 2020-06-25 DIAGNOSIS — E039 Hypothyroidism, unspecified: Secondary | ICD-10-CM | POA: Diagnosis not present

## 2020-06-25 DIAGNOSIS — I509 Heart failure, unspecified: Secondary | ICD-10-CM | POA: Diagnosis not present

## 2020-06-25 DIAGNOSIS — A0472 Enterocolitis due to Clostridium difficile, not specified as recurrent: Secondary | ICD-10-CM | POA: Diagnosis not present

## 2020-06-25 DIAGNOSIS — I495 Sick sinus syndrome: Secondary | ICD-10-CM | POA: Diagnosis not present

## 2020-06-25 DIAGNOSIS — I10 Essential (primary) hypertension: Secondary | ICD-10-CM | POA: Diagnosis not present

## 2020-06-25 DIAGNOSIS — R634 Abnormal weight loss: Secondary | ICD-10-CM | POA: Diagnosis not present

## 2020-06-25 DIAGNOSIS — D649 Anemia, unspecified: Secondary | ICD-10-CM | POA: Diagnosis not present

## 2020-06-25 DIAGNOSIS — Z950 Presence of cardiac pacemaker: Secondary | ICD-10-CM | POA: Diagnosis not present

## 2020-06-28 DIAGNOSIS — E039 Hypothyroidism, unspecified: Secondary | ICD-10-CM | POA: Diagnosis not present

## 2020-06-28 DIAGNOSIS — Z950 Presence of cardiac pacemaker: Secondary | ICD-10-CM | POA: Diagnosis not present

## 2020-06-28 DIAGNOSIS — R634 Abnormal weight loss: Secondary | ICD-10-CM | POA: Diagnosis not present

## 2020-06-28 DIAGNOSIS — I509 Heart failure, unspecified: Secondary | ICD-10-CM | POA: Diagnosis not present

## 2020-06-28 DIAGNOSIS — I495 Sick sinus syndrome: Secondary | ICD-10-CM | POA: Diagnosis not present

## 2020-06-28 DIAGNOSIS — I10 Essential (primary) hypertension: Secondary | ICD-10-CM | POA: Diagnosis not present

## 2020-06-28 DIAGNOSIS — A0472 Enterocolitis due to Clostridium difficile, not specified as recurrent: Secondary | ICD-10-CM | POA: Diagnosis not present

## 2020-06-28 DIAGNOSIS — D649 Anemia, unspecified: Secondary | ICD-10-CM | POA: Diagnosis not present

## 2020-06-28 DIAGNOSIS — E876 Hypokalemia: Secondary | ICD-10-CM | POA: Diagnosis not present

## 2020-07-03 DIAGNOSIS — R634 Abnormal weight loss: Secondary | ICD-10-CM | POA: Diagnosis not present

## 2020-07-03 DIAGNOSIS — E876 Hypokalemia: Secondary | ICD-10-CM | POA: Diagnosis not present

## 2020-07-03 DIAGNOSIS — Z950 Presence of cardiac pacemaker: Secondary | ICD-10-CM | POA: Diagnosis not present

## 2020-07-03 DIAGNOSIS — A0472 Enterocolitis due to Clostridium difficile, not specified as recurrent: Secondary | ICD-10-CM | POA: Diagnosis not present

## 2020-07-03 DIAGNOSIS — I495 Sick sinus syndrome: Secondary | ICD-10-CM | POA: Diagnosis not present

## 2020-07-03 DIAGNOSIS — D649 Anemia, unspecified: Secondary | ICD-10-CM | POA: Diagnosis not present

## 2020-07-03 DIAGNOSIS — I509 Heart failure, unspecified: Secondary | ICD-10-CM | POA: Diagnosis not present

## 2020-07-03 DIAGNOSIS — E039 Hypothyroidism, unspecified: Secondary | ICD-10-CM | POA: Diagnosis not present

## 2020-07-03 DIAGNOSIS — I10 Essential (primary) hypertension: Secondary | ICD-10-CM | POA: Diagnosis not present

## 2020-07-10 DIAGNOSIS — A0472 Enterocolitis due to Clostridium difficile, not specified as recurrent: Secondary | ICD-10-CM | POA: Diagnosis not present

## 2020-07-10 DIAGNOSIS — Z950 Presence of cardiac pacemaker: Secondary | ICD-10-CM | POA: Diagnosis not present

## 2020-07-10 DIAGNOSIS — I509 Heart failure, unspecified: Secondary | ICD-10-CM | POA: Diagnosis not present

## 2020-07-10 DIAGNOSIS — E039 Hypothyroidism, unspecified: Secondary | ICD-10-CM | POA: Diagnosis not present

## 2020-07-10 DIAGNOSIS — E876 Hypokalemia: Secondary | ICD-10-CM | POA: Diagnosis not present

## 2020-07-10 DIAGNOSIS — I495 Sick sinus syndrome: Secondary | ICD-10-CM | POA: Diagnosis not present

## 2020-07-10 DIAGNOSIS — I10 Essential (primary) hypertension: Secondary | ICD-10-CM | POA: Diagnosis not present

## 2020-07-10 DIAGNOSIS — R634 Abnormal weight loss: Secondary | ICD-10-CM | POA: Diagnosis not present

## 2020-07-10 DIAGNOSIS — D649 Anemia, unspecified: Secondary | ICD-10-CM | POA: Diagnosis not present

## 2020-07-13 DIAGNOSIS — D649 Anemia, unspecified: Secondary | ICD-10-CM | POA: Diagnosis not present

## 2020-07-13 DIAGNOSIS — E876 Hypokalemia: Secondary | ICD-10-CM | POA: Diagnosis not present

## 2020-07-13 DIAGNOSIS — R634 Abnormal weight loss: Secondary | ICD-10-CM | POA: Diagnosis not present

## 2020-07-13 DIAGNOSIS — A0472 Enterocolitis due to Clostridium difficile, not specified as recurrent: Secondary | ICD-10-CM | POA: Diagnosis not present

## 2020-07-13 DIAGNOSIS — Z950 Presence of cardiac pacemaker: Secondary | ICD-10-CM | POA: Diagnosis not present

## 2020-07-13 DIAGNOSIS — I10 Essential (primary) hypertension: Secondary | ICD-10-CM | POA: Diagnosis not present

## 2020-07-13 DIAGNOSIS — E039 Hypothyroidism, unspecified: Secondary | ICD-10-CM | POA: Diagnosis not present

## 2020-07-13 DIAGNOSIS — I495 Sick sinus syndrome: Secondary | ICD-10-CM | POA: Diagnosis not present

## 2020-07-13 DIAGNOSIS — I509 Heart failure, unspecified: Secondary | ICD-10-CM | POA: Diagnosis not present

## 2020-07-15 DIAGNOSIS — A0472 Enterocolitis due to Clostridium difficile, not specified as recurrent: Secondary | ICD-10-CM | POA: Diagnosis not present

## 2020-07-15 DIAGNOSIS — D649 Anemia, unspecified: Secondary | ICD-10-CM | POA: Diagnosis not present

## 2020-07-18 DIAGNOSIS — I495 Sick sinus syndrome: Secondary | ICD-10-CM | POA: Diagnosis not present

## 2020-07-18 DIAGNOSIS — E876 Hypokalemia: Secondary | ICD-10-CM | POA: Diagnosis not present

## 2020-07-18 DIAGNOSIS — R634 Abnormal weight loss: Secondary | ICD-10-CM | POA: Diagnosis not present

## 2020-07-18 DIAGNOSIS — E785 Hyperlipidemia, unspecified: Secondary | ICD-10-CM | POA: Diagnosis not present

## 2020-07-18 DIAGNOSIS — I509 Heart failure, unspecified: Secondary | ICD-10-CM | POA: Diagnosis not present

## 2020-07-18 DIAGNOSIS — I5032 Chronic diastolic (congestive) heart failure: Secondary | ICD-10-CM | POA: Diagnosis not present

## 2020-07-18 DIAGNOSIS — I10 Essential (primary) hypertension: Secondary | ICD-10-CM | POA: Diagnosis not present

## 2020-07-18 DIAGNOSIS — D649 Anemia, unspecified: Secondary | ICD-10-CM | POA: Diagnosis not present

## 2020-07-18 DIAGNOSIS — E039 Hypothyroidism, unspecified: Secondary | ICD-10-CM | POA: Diagnosis not present

## 2020-07-18 DIAGNOSIS — N179 Acute kidney failure, unspecified: Secondary | ICD-10-CM | POA: Diagnosis not present

## 2020-07-18 DIAGNOSIS — Z950 Presence of cardiac pacemaker: Secondary | ICD-10-CM | POA: Diagnosis not present

## 2020-07-18 DIAGNOSIS — A0472 Enterocolitis due to Clostridium difficile, not specified as recurrent: Secondary | ICD-10-CM | POA: Diagnosis not present

## 2020-07-18 DIAGNOSIS — I5033 Acute on chronic diastolic (congestive) heart failure: Secondary | ICD-10-CM | POA: Diagnosis not present

## 2020-07-24 DIAGNOSIS — A0472 Enterocolitis due to Clostridium difficile, not specified as recurrent: Secondary | ICD-10-CM | POA: Diagnosis not present

## 2020-07-24 DIAGNOSIS — Z950 Presence of cardiac pacemaker: Secondary | ICD-10-CM | POA: Diagnosis not present

## 2020-07-24 DIAGNOSIS — D649 Anemia, unspecified: Secondary | ICD-10-CM | POA: Diagnosis not present

## 2020-07-24 DIAGNOSIS — R634 Abnormal weight loss: Secondary | ICD-10-CM | POA: Diagnosis not present

## 2020-07-24 DIAGNOSIS — E039 Hypothyroidism, unspecified: Secondary | ICD-10-CM | POA: Diagnosis not present

## 2020-07-24 DIAGNOSIS — E876 Hypokalemia: Secondary | ICD-10-CM | POA: Diagnosis not present

## 2020-07-24 DIAGNOSIS — I509 Heart failure, unspecified: Secondary | ICD-10-CM | POA: Diagnosis not present

## 2020-07-24 DIAGNOSIS — I495 Sick sinus syndrome: Secondary | ICD-10-CM | POA: Diagnosis not present

## 2020-07-24 DIAGNOSIS — I10 Essential (primary) hypertension: Secondary | ICD-10-CM | POA: Diagnosis not present

## 2020-07-27 ENCOUNTER — Other Ambulatory Visit: Payer: Self-pay

## 2020-07-27 NOTE — Patient Outreach (Signed)
[facility]) Care Management  07/27/2020  SAANVIKA VAZQUES 09-30-1920 [id number]     Transition of Care Referral  Referral Date: 07/27/2020  Referral Source: [facility] Discharge Report Date of Discharge: 07/25/2020 Facility: [facility] Insurance: [facility] Medicare Initial Assessment   Outreach attempt # 1 to patient. Spoke with fmeal who idnetified herslef a paitetn's dughter in [place]w and requested call be compelted with her. No DPR on file for her.and advised of such. She advised to call patietn's son-Edward. RN CM conatcted son(DPR on file ) and assesment compelted with son who was not currently in the home with patietn. He reprots tha tpatietn is ding fairly well-no acute ssues sicne returnig home. Family is satying with patietn while she receivers. Son staets that they are looking for in home assistnace to helpw with caring for patient. He does nto think patietn qualifies for Mediciad. He is agreeable to SW referral fro assistance.   Conditions: Per chart review, patient has PMH that includes but not limited to CHF, complete heart block s/p pacer, TIA and hypothyrodiism. Patietn was recently discharged from SNF following inpatietn hosptial dmisison for CHF.   Medications Reviewed Today    Reviewed by Charlyn Minerva, RN (Registered Nurse) on 07/27/20 at 1028  Med List Status: <None>  Medication Order Taking? Sig Documenting Provider Last Dose Status Informant  aspirin 81 MG tablet [id number] No Take 81 mg by mouth daily. [provider] Taking Active   clobetasol cream (TEMOVATE) 0.05 % [id number] No Apply 1 application topically 2 (two) times daily. Felecia Shelling, DPM Taking Active   levothyroxine (SYNTHROID) 75 MCG tablet [id number] No  [provider] Taking Active   levothyroxine (SYNTHROID, LEVOTHROID) 50 MCG tablet [id number] No Take 1 tablet (50 mcg total) by mouth daily. Ellsworth Lennox, PA-C Taking Active   lidocaine (LIDODERM)  5 % [id number] No Place 1 patch onto the skin daily. Remove & Discard patch within 12 hours or as directed by MD Felecia Shelling, DPM Taking Active   losartan (COZAAR) 50 MG tablet [id number] No TAKE 1 TABLET EVERY DAY Croitoru, Mihai, MD Taking Active   Multiple Vitamin (MULTIVITAMIN) tablet [id number] No Take 1 tablet by mouth daily. [provider] Taking Active   pantoprazole (PROTONIX) 40 MG tablet [id number] No Take 1 tablet (40 mg total) by mouth daily. Croitoru, Mihai, MD Taking Active   pravastatin (PRAVACHOL) 40 MG tablet [id number]  Take 1 tablet (40 mg total) by mouth daily. Croitoru, Mihai, MD  Active   trandolapril-verapamil (TARKA) 1-240 MG tablet [id number] No Take 1 tablet by mouth at bedtime. [provider] Taking Active   triamcinolone cream (KENALOG) 0.1 % [id number] No Apply topically. [provider] Taking Active   verapamil (CALAN-SR) 240 MG CR tablet [id number]  Take 1 tablet (240 mg total) by mouth at bedtime. Croitoru, Rachelle Hora, MD  Active            Fall Risk  07/27/2020  Falls in the past year? 1  Number falls in past yr: 0  Injury with Fall? 0  Risk for fall due to : History of fall(s);Impaired mobility  Follow up Falls evaluation completed;Education provided   Depression screen [facility] 2/9 07/27/2020  Decreased Interest 0  Down, Depressed, Hopeless 0  PHQ - 2 Score 0   SDOH Screenings   Alcohol Screen: Not on file  Depression (PHQ2-9): Low Risk   . PHQ-2 Score: 0  Financial Resource Strain: Not on file  Food Insecurity: No  Food Insecurity  . Worried About Programme researcher, broadcasting/film/video in the Last Year: Never true  . Ran Out of Food in the Last Year: Never true  Housing: Not on file  Physical Activity: Not on file  Social Connections: Not on file  Stress: Not on file  Tobacco Use: Medium Risk  . Smoking Tobacco Use: Former Smoker  . Smokeless Tobacco Use: Never Used  Transportation Needs: No Transportation Needs  . Lack of Transportation  (Medical): No  . Lack of Transportation (Non-Medical): No   Goals Addressed              This Visit's Progress   .  (THN)Client/Caregiver will verbalize understanding of instructions related to self-care and safety (pt-stated)        Timeframe:  Long-Range Goal Priority:  High Start Date:   07/27/2020                          Expected End Date:  August 2022 Follow Up Date: May 2022  -family will arrange in home support/assistance -family will explore resources/options to assist with pt's care                      Notes: Son states that family currently staying with pt while she recovers. They are looking for in home support assistance to help them with patient's care. He is agreeable to SW referral for resources/assistance.    Marland Kitchen  [facility] and Keep All Appointments (pt-stated)        Timeframe:  Short-Term Goal Priority:  High Start Date: 07/27/2020                            Expected End Date:  June 2022                     Follow Up Date May 2022   - keep a calendar with prescription refill dates - keep a calendar with appointment dates    Why is this important?    Part of staying healthy is seeing the doctor for follow-up care.   If you forget your appointments, there are some things you can do to stay on track.    Notes:  07/27/20-Son states he called PCP yesterday to make follow up appt and is awaiting a return call.    .  (THN)Track and Manage Symptoms-Heart Failure (pt-stated)        Timeframe:  Short-Term Goal Priority:  High Start Date: 07/27/2020                             Expected End Date: June 2022                      Follow Up Date May 2022   - develop a rescue plan - follow rescue plan if symptoms flare-up - know when to call the doctor - track symptoms and what helps feel better or worse    Why is this important?    You will be able to handle your symptoms better if you keep track of them.   Making some simple changes to your lifestyle will  help.   Eating healthy is one thing you can do to take good care of yourself.    Notes:  07/27/20-Caregiver not in the home with pt at  present. He reports pt is doing fairly well since returning home. She has had no acute sxs. Son will monitor for s/s of HF.    .  Track and Manage Fluids and Swelling-Heart Failure        Consent: [facility] services reviewed and discussed withson. Verbal consent for services given.  Plan: RN CM discussed with caregiver next outreach within a week. Caregiver gave verbal consent and in agreement with RN CM follow up and timeframe. Caregiver aware that they may contact RN CM sooner for any issues or concerns. RN CM reviewed goals and plan of care with caregiver. Caregiver agrees to care plan and follow up. RN CM will send barriers letter and route encounter to PCP. RN CM will send welcome letter to patient. RN CM will send referral for assistance/resources with in home support.  Antionette Fairy, RN,BSN,CCM [facility] Care Management Telephonic Care Management Coordinator Direct Phone: [phone] Toll Free: [phone] Fax: [phone]

## 2020-07-28 DIAGNOSIS — E039 Hypothyroidism, unspecified: Secondary | ICD-10-CM | POA: Diagnosis not present

## 2020-07-28 DIAGNOSIS — I495 Sick sinus syndrome: Secondary | ICD-10-CM | POA: Diagnosis not present

## 2020-07-28 DIAGNOSIS — I110 Hypertensive heart disease with heart failure: Secondary | ICD-10-CM | POA: Diagnosis not present

## 2020-07-28 DIAGNOSIS — Z950 Presence of cardiac pacemaker: Secondary | ICD-10-CM | POA: Diagnosis not present

## 2020-07-28 DIAGNOSIS — E785 Hyperlipidemia, unspecified: Secondary | ICD-10-CM | POA: Diagnosis not present

## 2020-07-28 DIAGNOSIS — A0472 Enterocolitis due to Clostridium difficile, not specified as recurrent: Secondary | ICD-10-CM | POA: Diagnosis not present

## 2020-07-28 DIAGNOSIS — I5032 Chronic diastolic (congestive) heart failure: Secondary | ICD-10-CM | POA: Diagnosis not present

## 2020-07-29 DIAGNOSIS — R2681 Unsteadiness on feet: Secondary | ICD-10-CM | POA: Diagnosis not present

## 2020-07-29 DIAGNOSIS — I130 Hypertensive heart and chronic kidney disease with heart failure and stage 1 through stage 4 chronic kidney disease, or unspecified chronic kidney disease: Secondary | ICD-10-CM | POA: Diagnosis not present

## 2020-07-29 DIAGNOSIS — A0472 Enterocolitis due to Clostridium difficile, not specified as recurrent: Secondary | ICD-10-CM | POA: Diagnosis not present

## 2020-07-29 DIAGNOSIS — N1831 Chronic kidney disease, stage 3a: Secondary | ICD-10-CM | POA: Diagnosis not present

## 2020-07-29 DIAGNOSIS — E46 Unspecified protein-calorie malnutrition: Secondary | ICD-10-CM | POA: Diagnosis not present

## 2020-07-29 DIAGNOSIS — I5032 Chronic diastolic (congestive) heart failure: Secondary | ICD-10-CM | POA: Diagnosis not present

## 2020-07-30 DIAGNOSIS — E039 Hypothyroidism, unspecified: Secondary | ICD-10-CM | POA: Diagnosis not present

## 2020-07-30 DIAGNOSIS — A0472 Enterocolitis due to Clostridium difficile, not specified as recurrent: Secondary | ICD-10-CM | POA: Diagnosis not present

## 2020-07-30 DIAGNOSIS — I110 Hypertensive heart disease with heart failure: Secondary | ICD-10-CM | POA: Diagnosis not present

## 2020-07-30 DIAGNOSIS — I5032 Chronic diastolic (congestive) heart failure: Secondary | ICD-10-CM | POA: Diagnosis not present

## 2020-07-30 DIAGNOSIS — E785 Hyperlipidemia, unspecified: Secondary | ICD-10-CM | POA: Diagnosis not present

## 2020-07-30 DIAGNOSIS — Z950 Presence of cardiac pacemaker: Secondary | ICD-10-CM | POA: Diagnosis not present

## 2020-07-30 DIAGNOSIS — I495 Sick sinus syndrome: Secondary | ICD-10-CM | POA: Diagnosis not present

## 2020-08-02 DIAGNOSIS — I495 Sick sinus syndrome: Secondary | ICD-10-CM | POA: Diagnosis not present

## 2020-08-02 DIAGNOSIS — I5032 Chronic diastolic (congestive) heart failure: Secondary | ICD-10-CM | POA: Diagnosis not present

## 2020-08-02 DIAGNOSIS — A0472 Enterocolitis due to Clostridium difficile, not specified as recurrent: Secondary | ICD-10-CM | POA: Diagnosis not present

## 2020-08-02 DIAGNOSIS — Z950 Presence of cardiac pacemaker: Secondary | ICD-10-CM | POA: Diagnosis not present

## 2020-08-02 DIAGNOSIS — E039 Hypothyroidism, unspecified: Secondary | ICD-10-CM | POA: Diagnosis not present

## 2020-08-02 DIAGNOSIS — E785 Hyperlipidemia, unspecified: Secondary | ICD-10-CM | POA: Diagnosis not present

## 2020-08-02 DIAGNOSIS — I110 Hypertensive heart disease with heart failure: Secondary | ICD-10-CM | POA: Diagnosis not present

## 2020-08-03 ENCOUNTER — Other Ambulatory Visit: Payer: Self-pay

## 2020-08-03 NOTE — Patient Outreach (Signed)
[facility]) Care Management  08/03/2020  Abigail Melendez January 25, 1921 [id number]   Transition of Care Telephone Assessment     Outreach attempt #1 to patient/caregiver. Line busy.     Plan: RN CM will make outreach attempt to patient within 3-4 business days.  Antionette Fairy, RN,BSN,CCM [facility] Care Management Telephonic Care Management Coordinator Direct Phone: [phone] Toll Free: [phone] Fax: [phone]

## 2020-08-04 ENCOUNTER — Telehealth: Payer: Self-pay | Admitting: *Deleted

## 2020-08-04 NOTE — Patient Outreach (Signed)
[facility]) Care Management  08/04/2020  Abigail Melendez [birth year] [id number]   CSW spoke with pt's son and confirmed pt's identity. Per son, pt is [facility] and very difficult to speak with by phone. Per son, he has been staying with her some since she fell  she was sent to SNF post hospital stay (March) and released to home around Mother's Day.  Prior to that, she was living alone and quite independent. Pt's son is wanting to seek information and services that may offer some in home support to pt.  Since her dc last week to home, she is getting HH through [facility] for PT/OT, etc.  CSW inquired with son about pt's income- it appears she is over the allowed amount for Medicaid based on her SS and Retirement checks. She may be eligible for the Veteran's Aide and Attendant program as her deceased husband was a Cytogeneticist.  She also may benefit from some services that [facility] offers as well as community agencies.  CSW will mail son resources and info to review and to consider.   CSW made contact with [facility] agency- they would need MD to send order to add HHSW if needed/desired. Will assess further and request if needed.  CSW will plan a follow up call to pt's son again in 2 weeks.    [name], MSW, LCSW Clinical Social Worker  [facility] [phone]

## 2020-08-05 ENCOUNTER — Other Ambulatory Visit: Payer: Self-pay

## 2020-08-05 DIAGNOSIS — A0472 Enterocolitis due to Clostridium difficile, not specified as recurrent: Secondary | ICD-10-CM | POA: Diagnosis not present

## 2020-08-05 DIAGNOSIS — I110 Hypertensive heart disease with heart failure: Secondary | ICD-10-CM | POA: Diagnosis not present

## 2020-08-05 DIAGNOSIS — I5032 Chronic diastolic (congestive) heart failure: Secondary | ICD-10-CM | POA: Diagnosis not present

## 2020-08-05 DIAGNOSIS — E039 Hypothyroidism, unspecified: Secondary | ICD-10-CM | POA: Diagnosis not present

## 2020-08-05 DIAGNOSIS — E785 Hyperlipidemia, unspecified: Secondary | ICD-10-CM | POA: Diagnosis not present

## 2020-08-05 DIAGNOSIS — I495 Sick sinus syndrome: Secondary | ICD-10-CM | POA: Diagnosis not present

## 2020-08-05 DIAGNOSIS — Z950 Presence of cardiac pacemaker: Secondary | ICD-10-CM | POA: Diagnosis not present

## 2020-08-05 NOTE — Patient Outreach (Signed)
[facility]) Care Management  08/05/2020  SHERITHA LOUIS 10-Mar-1921 [id number]   Telephone Assessment Transition of Care    Unsuccessful outreach attempt # 2.    Plan: RN CM will make outreach attempt within 3-4 business days.  Antionette Fairy, RN,BSN,CCM [facility] Care Management Telephonic Care Management Coordinator Direct Phone: [phone] Toll Free: [phone] Fax: [phone]

## 2020-08-09 DIAGNOSIS — I110 Hypertensive heart disease with heart failure: Secondary | ICD-10-CM | POA: Diagnosis not present

## 2020-08-09 DIAGNOSIS — E785 Hyperlipidemia, unspecified: Secondary | ICD-10-CM | POA: Diagnosis not present

## 2020-08-09 DIAGNOSIS — E039 Hypothyroidism, unspecified: Secondary | ICD-10-CM | POA: Diagnosis not present

## 2020-08-09 DIAGNOSIS — I495 Sick sinus syndrome: Secondary | ICD-10-CM | POA: Diagnosis not present

## 2020-08-09 DIAGNOSIS — I5032 Chronic diastolic (congestive) heart failure: Secondary | ICD-10-CM | POA: Diagnosis not present

## 2020-08-09 DIAGNOSIS — Z950 Presence of cardiac pacemaker: Secondary | ICD-10-CM | POA: Diagnosis not present

## 2020-08-09 DIAGNOSIS — A0472 Enterocolitis due to Clostridium difficile, not specified as recurrent: Secondary | ICD-10-CM | POA: Diagnosis not present

## 2020-08-10 DIAGNOSIS — E785 Hyperlipidemia, unspecified: Secondary | ICD-10-CM | POA: Diagnosis not present

## 2020-08-10 DIAGNOSIS — I495 Sick sinus syndrome: Secondary | ICD-10-CM | POA: Diagnosis not present

## 2020-08-10 DIAGNOSIS — Z950 Presence of cardiac pacemaker: Secondary | ICD-10-CM | POA: Diagnosis not present

## 2020-08-10 DIAGNOSIS — I5032 Chronic diastolic (congestive) heart failure: Secondary | ICD-10-CM | POA: Diagnosis not present

## 2020-08-10 DIAGNOSIS — E039 Hypothyroidism, unspecified: Secondary | ICD-10-CM | POA: Diagnosis not present

## 2020-08-10 DIAGNOSIS — A0472 Enterocolitis due to Clostridium difficile, not specified as recurrent: Secondary | ICD-10-CM | POA: Diagnosis not present

## 2020-08-10 DIAGNOSIS — I110 Hypertensive heart disease with heart failure: Secondary | ICD-10-CM | POA: Diagnosis not present

## 2020-08-12 ENCOUNTER — Other Ambulatory Visit: Payer: Self-pay

## 2020-08-12 NOTE — Patient Outreach (Addendum)
[facility]) Care Management  08/12/2020  ANAISSA MACFADDEN 12-Sep-1920 [id number]   Telephone Assessment   Successful call placed to patient's son/caregiver. Spoke briefly ashe was having phone trouble. He states that patient is doing well-denies any new issues or concerns at present. She completed PCP follow up appt and no changes made. Son states that he received packet of info from SW but has not really looked at it in depth. RN CM encouraged son to review info and contact [facility] staff if needed for any questions or concerns. He denies any RN CM needs or concerns at this time.     Medications Reviewed Today    Reviewed by Charlyn Minerva, RN (Registered Nurse) on 08/12/20 at [phone]  Med List Status: <None>  Medication Order Taking? Sig Documenting Provider Last Dose Status Informant  aspirin 81 MG tablet [id number] No Take 81 mg by mouth daily. [provider] Taking Active   clobetasol cream (TEMOVATE) 0.05 % [id number] No Apply 1 application topically 2 (two) times daily. Felecia Shelling, DPM Taking Active   levothyroxine (SYNTHROID) 75 MCG tablet [id number] No  [provider] Taking Active   levothyroxine (SYNTHROID, LEVOTHROID) 50 MCG tablet [id number] No Take 1 tablet (50 mcg total) by mouth daily. Ellsworth Lennox, PA-C Taking Active   lidocaine (LIDODERM) 5 % [id number] No Place 1 patch onto the skin daily. Remove & Discard patch within 12 hours or as directed by MD Felecia Shelling, DPM Taking Active   losartan (COZAAR) 50 MG tablet [id number] No TAKE 1 TABLET EVERY DAY Croitoru, Mihai, MD Taking Active   Multiple Vitamin (MULTIVITAMIN) tablet [id number] No Take 1 tablet by mouth daily. [provider] Taking Active   pantoprazole (PROTONIX) 40 MG tablet [id number] No Take 1 tablet (40 mg total) by mouth daily. Croitoru, Mihai, MD Taking Active   pravastatin (PRAVACHOL) 40 MG tablet [id number]  Take 1 tablet (40 mg total) by mouth daily.  Croitoru, Mihai, MD  Active   trandolapril-verapamil (TARKA) 1-240 MG tablet [id number] No Take 1 tablet by mouth at bedtime. [provider] Taking Active   triamcinolone cream (KENALOG) 0.1 % [id number] No Apply topically. [provider] Taking Active   verapamil (CALAN-SR) 240 MG CR tablet [id number]  Take 1 tablet (240 mg total) by mouth at bedtime. Croitoru, Rachelle Hora, MD  Active           Goals Addressed              This Visit's Progress   .  (THN)Client/Caregiver will verbalize understanding of instructions related to self-care and safety (pt-stated)        Timeframe:  Long-Range Goal Priority:  High Start Date:   07/27/2020                          Expected End Date:  August 2022 Follow Up Date: June 2022   Barriers: Health Behaviors Knowledge   -family will arrange in home support/assistance -family will explore resources/options to assist with pt's care                      Notes: Son states that family currently staying with pt while she recovers. They are looking for in home support assistance to help them with patient's care. He is agreeable to SW referral for resources/assistance.  08/12/20-Son confirms that [facility] SW has spoken with him and mailed him resources/info. He has not looked at  info in depth andadvised to do so. Family continues to rotate and provide care to patient.    .  COMPLETED: (THN)Make and Keep All Appointments (pt-stated)        Timeframe:  Short-Term Goal Priority:  High Start Date: 07/27/2020                            Expected End Date:  June 2022                     Follow Up Date June 2022   - keep a calendar with prescription refill dates - keep a calendar with appointment dates    Why is this important?    Part of staying healthy is seeing the doctor for follow-up care.   If you forget your appointments, there are some things you can do to stay on track.    Notes:  07/27/20-Son states he called PCP yesterday to make  follow up appt and is awaiting a return call.  08/12/20-Son confirms that patient saw PCP. He reports appt went well and no issues.     .  (THN)Track and Manage Fluids and Swelling-Heart Failure (pt-stated)        Timeframe:  Short-Term Goal Priority:  High Start Date: 08/12/2020                             Expected End Date: June 2022                       Follow Up Date June 2022  Barriers: Health Behaviors Knowledge    - call office if I gain more than 2 pounds in one day or 5 pounds in one week - keep legs up while sitting - track weight in diary - use salt in moderation - watch for swelling in feet, ankles and legs every day - weigh myself daily    Why is this important?    It is important to check your weight daily and watch how much salt and liquids you have.   It will help you to manage your heart failure.    Notes:  08/12/20-Son unsure of patient's wgt. Reinforced importance of weighing daily and/or at least 2-3x/week. Patient adherent to low salt diet.     .  (THN)Track and Manage Symptoms-Heart Failure (pt-stated)        Timeframe:  Short-Term Goal Priority:  High Start Date: 07/27/2020                             Expected End Date: June 2022                      Follow Up Date June2022    Barriers: Health Behaviors Knowledge   - develop a rescue plan - follow rescue plan if symptoms flare-up - know when to call the doctor - track symptoms and what helps feel better or worse    Why is this important?    You will be able to handle your symptoms better if you keep track of them.   Making some simple changes to your lifestyle will help.   Eating healthy is one thing you can do to take good care of yourself.    Notes:  07/27/20-Caregiver not in the home with pt at present.  He reports pt is doing fairly well since returning home. She has had no acute sxs. Son will monitor for s/s of HF.  08/12/20-Son states that patient has had no acute issues. He is  unsure of wgt. Denies patient having and edema/swelling/SOB.          Plan: RN CM discussed with caregiver next outreach within four weeks . Caregiver gave verbal consent and in agreement with RN CM follow up and timeframe. Caregiver aware that they may contact RN CM sooner for any issues or concerns. RN CM reviewed goals and plan of care with caregiver. Caregiver agrees to care plan and follow up.  Antionette Fairy, RN,BSN,CCM [facility] Care Management Telephonic Care Management Coordinator Direct Phone: [phone] Toll Free: [phone] Fax: [phone]

## 2020-08-17 ENCOUNTER — Ambulatory Visit: Payer: Self-pay | Admitting: *Deleted

## 2020-08-17 ENCOUNTER — Telehealth: Payer: Self-pay | Admitting: *Deleted

## 2020-08-17 NOTE — Patient Outreach (Signed)
[facility]) Care Management  08/17/2020  Abigail Melendez [birth year] [id number]    CSW attempted to reach pt/family and was unable. CSW left a HIPPA compliant voice message for son and will await callback or try again in 3-4 business days.    [name], MSW, LCSW Clinical Social Worker  [facility] [employer] [phone]

## 2020-08-18 ENCOUNTER — Ambulatory Visit (INDEPENDENT_AMBULATORY_CARE_PROVIDER_SITE_OTHER): Payer: Medicare HMO

## 2020-08-18 DIAGNOSIS — I442 Atrioventricular block, complete: Secondary | ICD-10-CM | POA: Diagnosis not present

## 2020-08-18 DIAGNOSIS — R197 Diarrhea, unspecified: Secondary | ICD-10-CM | POA: Diagnosis not present

## 2020-08-18 DIAGNOSIS — A0472 Enterocolitis due to Clostridium difficile, not specified as recurrent: Secondary | ICD-10-CM | POA: Diagnosis not present

## 2020-08-19 DIAGNOSIS — E039 Hypothyroidism, unspecified: Secondary | ICD-10-CM | POA: Diagnosis not present

## 2020-08-19 DIAGNOSIS — I5032 Chronic diastolic (congestive) heart failure: Secondary | ICD-10-CM | POA: Diagnosis not present

## 2020-08-19 DIAGNOSIS — E785 Hyperlipidemia, unspecified: Secondary | ICD-10-CM | POA: Diagnosis not present

## 2020-08-19 DIAGNOSIS — I495 Sick sinus syndrome: Secondary | ICD-10-CM | POA: Diagnosis not present

## 2020-08-19 DIAGNOSIS — Z950 Presence of cardiac pacemaker: Secondary | ICD-10-CM | POA: Diagnosis not present

## 2020-08-19 DIAGNOSIS — I110 Hypertensive heart disease with heart failure: Secondary | ICD-10-CM | POA: Diagnosis not present

## 2020-08-19 DIAGNOSIS — A0472 Enterocolitis due to Clostridium difficile, not specified as recurrent: Secondary | ICD-10-CM | POA: Diagnosis not present

## 2020-08-19 LAB — CUP PACEART REMOTE DEVICE CHECK
Battery Remaining Longevity: 103 mo
Battery Remaining Percentage: 95.5 %
Battery Voltage: 2.99 V
Brady Statistic AP VP Percent: 25 %
Brady Statistic AP VS Percent: 1 %
Brady Statistic AS VP Percent: 75 %
Brady Statistic AS VS Percent: 1 %
Brady Statistic RA Percent Paced: 24 %
Brady Statistic RV Percent Paced: 99 %
Date Time Interrogation Session: 20220601020014
Implantable Lead Implant Date: 19910923
Implantable Lead Implant Date: 19910923
Implantable Lead Location: 753859
Implantable Lead Location: 753860
Implantable Pulse Generator Implant Date: 20190123
Lead Channel Impedance Value: 240 Ohm
Lead Channel Impedance Value: 310 Ohm
Lead Channel Pacing Threshold Amplitude: 0.875 V
Lead Channel Pacing Threshold Amplitude: 1 V
Lead Channel Pacing Threshold Pulse Width: 0.5 ms
Lead Channel Pacing Threshold Pulse Width: 0.5 ms
Lead Channel Sensing Intrinsic Amplitude: 1.5 mV
Lead Channel Setting Pacing Amplitude: 1.125
Lead Channel Setting Pacing Amplitude: 2.5 V
Lead Channel Setting Pacing Pulse Width: 0.5 ms
Lead Channel Setting Sensing Sensitivity: 5 mV
Pulse Gen Model: 2272
Pulse Gen Serial Number: 8980383

## 2020-08-20 ENCOUNTER — Other Ambulatory Visit: Payer: Self-pay | Admitting: *Deleted

## 2020-08-20 NOTE — Patient Outreach (Signed)
[facility]) Care Management  08/20/2020  Abigail Melendez Jun 17, 1920 [id number]   CSW spoke with pt's son by phone who confirmed receiving mailed info for his review. He is looking into the [employer] and [employer] program as well reports the Meals on Wheels assessment will take place next week.  He is reminded to review the [facility] benefit material also.  Pt continues to get home health care also and per son, "we are thinking about just moving in with her". CSW validated his thought process with this possible option; in order to keep her at home and to have someone with her.   CSW offered support and encouragement to son- advised son of plans to sign off and reminded him to call if further needs or concerns arise that we can assist with. CSW will advise PCP and [facility] team of above plans.  [name], MSW, LCSW Clinical Social Worker  [facility] [employer] [phone]

## 2020-08-24 DIAGNOSIS — I5032 Chronic diastolic (congestive) heart failure: Secondary | ICD-10-CM | POA: Diagnosis not present

## 2020-08-24 DIAGNOSIS — E039 Hypothyroidism, unspecified: Secondary | ICD-10-CM | POA: Diagnosis not present

## 2020-08-24 DIAGNOSIS — Z950 Presence of cardiac pacemaker: Secondary | ICD-10-CM | POA: Diagnosis not present

## 2020-08-24 DIAGNOSIS — I110 Hypertensive heart disease with heart failure: Secondary | ICD-10-CM | POA: Diagnosis not present

## 2020-08-24 DIAGNOSIS — A0472 Enterocolitis due to Clostridium difficile, not specified as recurrent: Secondary | ICD-10-CM | POA: Diagnosis not present

## 2020-08-24 DIAGNOSIS — I495 Sick sinus syndrome: Secondary | ICD-10-CM | POA: Diagnosis not present

## 2020-08-24 DIAGNOSIS — E785 Hyperlipidemia, unspecified: Secondary | ICD-10-CM | POA: Diagnosis not present

## 2020-08-25 DIAGNOSIS — I110 Hypertensive heart disease with heart failure: Secondary | ICD-10-CM | POA: Diagnosis not present

## 2020-08-25 DIAGNOSIS — A0472 Enterocolitis due to Clostridium difficile, not specified as recurrent: Secondary | ICD-10-CM | POA: Diagnosis not present

## 2020-08-25 DIAGNOSIS — I5032 Chronic diastolic (congestive) heart failure: Secondary | ICD-10-CM | POA: Diagnosis not present

## 2020-08-25 DIAGNOSIS — E039 Hypothyroidism, unspecified: Secondary | ICD-10-CM | POA: Diagnosis not present

## 2020-08-25 DIAGNOSIS — E785 Hyperlipidemia, unspecified: Secondary | ICD-10-CM | POA: Diagnosis not present

## 2020-08-25 DIAGNOSIS — I495 Sick sinus syndrome: Secondary | ICD-10-CM | POA: Diagnosis not present

## 2020-08-25 DIAGNOSIS — Z950 Presence of cardiac pacemaker: Secondary | ICD-10-CM | POA: Diagnosis not present

## 2020-09-06 DIAGNOSIS — Z950 Presence of cardiac pacemaker: Secondary | ICD-10-CM | POA: Diagnosis not present

## 2020-09-06 DIAGNOSIS — A0472 Enterocolitis due to Clostridium difficile, not specified as recurrent: Secondary | ICD-10-CM | POA: Diagnosis not present

## 2020-09-06 DIAGNOSIS — E039 Hypothyroidism, unspecified: Secondary | ICD-10-CM | POA: Diagnosis not present

## 2020-09-06 DIAGNOSIS — E785 Hyperlipidemia, unspecified: Secondary | ICD-10-CM | POA: Diagnosis not present

## 2020-09-06 DIAGNOSIS — I110 Hypertensive heart disease with heart failure: Secondary | ICD-10-CM | POA: Diagnosis not present

## 2020-09-06 DIAGNOSIS — I495 Sick sinus syndrome: Secondary | ICD-10-CM | POA: Diagnosis not present

## 2020-09-06 DIAGNOSIS — I5032 Chronic diastolic (congestive) heart failure: Secondary | ICD-10-CM | POA: Diagnosis not present

## 2020-09-09 ENCOUNTER — Other Ambulatory Visit: Payer: Self-pay

## 2020-09-09 NOTE — Patient Outreach (Signed)
[facility]) Care Management  09/09/2020  Abigail Melendez [birth year] [id number]   Telephone Assessment   Unsuccessful outreach attempt to patient.     Plan: RN CM will make outreach attempt to patient within 3-4 business days.  Antionette Fairy, RN,BSN,CCM [facility] Care Management Telephonic Care Management Coordinator Direct Phone: [phone] Toll Free: [phone] Fax: [phone]

## 2020-09-10 NOTE — Progress Notes (Signed)
Remote pacemaker transmission.   

## 2020-09-14 DIAGNOSIS — I5032 Chronic diastolic (congestive) heart failure: Secondary | ICD-10-CM | POA: Diagnosis not present

## 2020-09-14 DIAGNOSIS — I495 Sick sinus syndrome: Secondary | ICD-10-CM | POA: Diagnosis not present

## 2020-09-14 DIAGNOSIS — Z950 Presence of cardiac pacemaker: Secondary | ICD-10-CM | POA: Diagnosis not present

## 2020-09-14 DIAGNOSIS — E785 Hyperlipidemia, unspecified: Secondary | ICD-10-CM | POA: Diagnosis not present

## 2020-09-14 DIAGNOSIS — A0472 Enterocolitis due to Clostridium difficile, not specified as recurrent: Secondary | ICD-10-CM | POA: Diagnosis not present

## 2020-09-14 DIAGNOSIS — E039 Hypothyroidism, unspecified: Secondary | ICD-10-CM | POA: Diagnosis not present

## 2020-09-14 DIAGNOSIS — I110 Hypertensive heart disease with heart failure: Secondary | ICD-10-CM | POA: Diagnosis not present

## 2020-09-15 ENCOUNTER — Other Ambulatory Visit: Payer: Self-pay

## 2020-09-15 NOTE — Patient Outreach (Signed)
[facility]) Care Management  09/15/2020  Abigail Melendez September 26, 1920 [id number]   Telephone Assessment    Unsuccessful outreach attempt # 2.     Plan: RN CM will make outreach attempt within 3-4 business days.  Antionette Fairy, RN,BSN,CCM [facility] Care Management Telephonic Care Management Coordinator Direct Phone: [phone] Toll Free: [phone] Fax: [phone]

## 2020-09-16 ENCOUNTER — Other Ambulatory Visit: Payer: Self-pay

## 2020-09-16 NOTE — Patient Outreach (Signed)
[facility]) Care Management  09/16/2020  Abigail Melendez December 23, 1920 [id number]   Telephone Assessment    Successful call placed to patient's son/caregiver-Edward. Spoke briefly with son. He voices that patient is doing well-no new issues or concerns at present. She had MD appt about three weeks ago and appt went well per son's report. Family continues to rotate and provide home support/care to assist patient. Appetite remains WNL. Wgt stable. Denies any recent falls. Son denies any RN CM needs or concerns at this time.   Medications Reviewed Today     Reviewed by Charlyn Minerva, RN (Registered Nurse) on 09/16/20 at [phone]  Med List Status: <None>   Medication Order Taking? Sig Documenting Provider Last Dose Status Informant  aspirin 81 MG tablet [id number] No Take 81 mg by mouth daily. [provider] Taking Active   clobetasol cream (TEMOVATE) 0.05 % [id number] No Apply 1 application topically 2 (two) times daily. Felecia Shelling, DPM Taking Active   levothyroxine (SYNTHROID) 75 MCG tablet [id number] No  [provider] Taking Active   levothyroxine (SYNTHROID, LEVOTHROID) 50 MCG tablet [id number] No Take 1 tablet (50 mcg total) by mouth daily. Ellsworth Lennox, PA-C Taking Active   lidocaine (LIDODERM) 5 % [id number] No Place 1 patch onto the skin daily. Remove & Discard patch within 12 hours or as directed by MD Felecia Shelling, DPM Taking Active   losartan (COZAAR) 50 MG tablet [id number] No TAKE 1 TABLET EVERY DAY Croitoru, Mihai, MD Taking Active   Multiple Vitamin (MULTIVITAMIN) tablet [id number] No Take 1 tablet by mouth daily. [provider] Taking Active   pantoprazole (PROTONIX) 40 MG tablet [id number] No Take 1 tablet (40 mg total) by mouth daily. Croitoru, Mihai, MD Taking Active   pravastatin (PRAVACHOL) 40 MG tablet [id number]  Take 1 tablet (40 mg total) by mouth daily. Croitoru, Mihai, MD  Active   trandolapril-verapamil (TARKA)  1-240 MG tablet [id number] No Take 1 tablet by mouth at bedtime. [provider] Taking Active   triamcinolone cream (KENALOG) 0.1 % [id number] No Apply topically. [provider] Taking Active   verapamil (CALAN-SR) 240 MG CR tablet [id number]  Take 1 tablet (240 mg total) by mouth at bedtime. Croitoru, Rachelle Hora, MD  Active              Goals Addressed               This Visit's Progress     (THN)Client/Caregiver will verbalize understanding of instructions related to self-care and safety (pt-stated)        Timeframe:  Long-Range Goal Priority:  High Start Date:   07/27/2020                          Expected End Date:  August 2022 Follow Up Date: July 2022   Barriers: Health Behaviors Knowledge   -family will arrange in home support/assistance -family will explore resources/options to assist with pt's care                      Notes: Son states that family currently staying with pt while she recovers. They are looking for in home support assistance to help them with patient's care. He is agreeable to SW referral for resources/assistance.  08/12/20-Son confirms that [facility] SW has spoken with him and mailed him resources/info. He has not looked at info in depth andadvised to do so. Family continues to rotate  and provide care to patient.  09/16/20-Son states he received info about assistance in home from SW. Family has decided to continue to rotate and provide services in the home for patient at [facility] time. Patient did get approved for MOWs to assist them.        COMPLETED: (THN)Make and Keep All Appointments (pt-stated)        Timeframe:  Short-Term Goal Priority:  High Start Date: 07/27/2020                            Expected End Date:  June 2022                     Follow Up Date June 2022  Barriers: None    - keep a calendar with prescription refill dates - keep a calendar with appointment dates    Why is this important?   Part of staying healthy is seeing  the doctor for follow-up care.  If you forget your appointments, there are some things you can do to stay on track.    Notes:  07/27/20-Son states he called PCP yesterday to make follow up appt and is awaiting a return call.  08/12/20-Son confirms that patient saw PCP. He reports appt went well and no issues.   09/16/20-Son reports patient has completed all post discharge MD f/u appts.       (THN)Symptom Exacerbation Prevented or Minimized (pt-stated)         Timeframe:  Short-Term Goal Priority:  High Start Date:  09/16/2020                           Expected End Date: July 2022 Follow Up Date: July 2022   Barriers: Health Behaviors Knowledge    -monitor for s/s of fluid retention -adhere to low salt diet -pt will experience no exacerbations of sxs                      Evidence-based guidance:  Perform or review cognitive and/or health literacy screening.  Assess understanding of adherence and barriers to treatment plan, as well as lifestyle changes; develop strategies to address barriers.  Establish a mutually-agreed-upon early intervention process to communicate with primary care provider when signs/symptoms worsen.  Facilitate timely posthospital discharge or emergency department treatment that includes intensive follow-up via telephone calls, home visit, telehealth monitoring and care at multidisciplinary heart failure clinic.  Adjust frequency and intensity of follow-up based on presentation, number of emergency department visits, hospital admissions and frequency and severity of symptom exacerbation.  Facilitate timely visit, usually within 1 week, with primary care provider following hospital discharge.  Collaborate with clinical pharmacist to address adverse drug reactions, drug interactions, subtherapeutic dosage, patient and family education.  Regularly screen for presence of depressive symptoms using a validated tool; consider pharmacologic therapy and/or referral for  cognitive behavioral therapy when present.  Refer to community-based services, such as a heart failure support group, community Medical laboratory scientific officer or peer support program.  Review immunization status; arrange receipt of needed vaccinations.  Prepare patient for home oxygen use based on signs/symptoms.   Notes:   09/16/20-Son denies any acute HF sxs at present.        (THN)Track and Manage Fluids and Swelling-Heart Failure (pt-stated)        Timeframe:  Short-Term Goal Priority:  High Start Date: 08/12/2020  Expected End Date: July 2022                       Follow Up Date July 2022  Barriers: Health Behaviors Knowledge    - call office if I gain more than 2 pounds in one day or 5 pounds in one week - keep legs up while sitting - track weight in diary - use salt in moderation - watch for swelling in feet, ankles and legs every day - weigh myself daily    Why is this important?   It is important to check your weight daily and watch how much salt and liquids you have.  It will help you to manage your heart failure.    Notes:  08/12/20-Son unsure of patient's wgt. Reinforced importance of weighing daily and/or at least 2-3x/week. Patient adherent to low salt diet.   09/16/20-Son unsure of wgt but reports wgt stable. Family does not consistently weigh pt daily. Reinforced importance.He denies any HF sxs(SOB,edema,etc)       COMPLETED: (THN)Track and Manage Symptoms-Heart Failure (pt-stated)        Timeframe:  Short-Term Goal Priority:  High Start Date: 07/27/2020                             Expected End Date: June 2022                      Follow Up Date June2022    Barriers: Health Behaviors Knowledge   - develop a rescue plan - follow rescue plan if symptoms flare-up - know when to call the doctor - track symptoms and what helps feel better or worse    Why is this important?   You will be able to handle your symptoms better if you keep track of  them.  Making some simple changes to your lifestyle will help.  Eating healthy is one thing you can do to take good care of yourself.    Notes:  07/27/20-Caregiver not in the home with pt at present. He reports pt is doing fairly well since returning home. She has had no acute sxs. Son will monitor for s/s of HF.  08/12/20-Son states that patient has had no acute issues. He is unsure of wgt. Denies patient having and edema/swelling/SOB.   09/16/20-Son voices that patient has not experienced any exacerbations or sxs of HF noted.            Plan:  RN CM discussed with caregiver next outreach within four weeks. Caregiver gave verbal consent and in agreement with RN CM follow up and timeframe. Caregiver aware that they may contact RN CM sooner for any issues or concerns. RN CM reviewed goals and plan of care with caregiver. Caregiver agrees to care plan and follow up.  Antionette Fairy, RN,BSN,CCM [facility] Care Management Telephonic Care Management Coordinator Direct Phone: [phone] Toll Free: [phone] Fax: [phone]

## 2020-10-06 ENCOUNTER — Other Ambulatory Visit: Payer: Self-pay

## 2020-10-06 NOTE — Patient Outreach (Signed)
[facility]) Care Management  10/06/2020  Abigail Melendez 1921-02-13 [id number]   Telephone Assessment   Unsuccessful outreach attempt.    Plan: RN CM will make outreach attempt to patient/caregiver within 3-4 business days.   Antionette Fairy, RN,BSN,CCM [facility] Care Management Telephonic Care Management Coordinator Direct Phone: [phone] Toll Free: [phone] Fax: [phone]

## 2020-10-07 ENCOUNTER — Other Ambulatory Visit: Payer: Self-pay

## 2020-10-07 NOTE — Patient Outreach (Signed)
[facility]) Care Management  10/07/2020  Abigail Melendez 09-19-1920 [id number]   Telephone Assessment   Unsuccessful outreach attempt to patient/caregiver.   Plan: RN CM will make outreach attempt within 3-4 business days.   Antionette Fairy, RN,BSN,CCM [facility] Care Management Telephonic Care Management Coordinator Direct Phone: [phone] Toll Free: [phone] Fax: [phone]

## 2020-10-08 ENCOUNTER — Other Ambulatory Visit: Payer: Self-pay

## 2020-10-08 NOTE — Patient Outreach (Signed)
[facility]) Care Management  10/08/2020  SHARADA ALBORNOZ 1920-08-22 [id number]   Telephone Assessment    Unsuccessful outreach attempt # 3 to patient/caregiver. No answer at present.     Plan: RN CM will make outreach attempt within the month of Aug.   Claudetta Sallie [facility] Care Management Telephonic Care Management Coordinator Direct Phone: [phone] Toll Free: [phone] Fax: [phone]

## 2020-10-12 DIAGNOSIS — N390 Urinary tract infection, site not specified: Secondary | ICD-10-CM | POA: Diagnosis not present

## 2020-10-12 DIAGNOSIS — E871 Hypo-osmolality and hyponatremia: Secondary | ICD-10-CM | POA: Diagnosis not present

## 2020-10-12 DIAGNOSIS — R5383 Other fatigue: Secondary | ICD-10-CM | POA: Diagnosis not present

## 2020-10-12 DIAGNOSIS — R197 Diarrhea, unspecified: Secondary | ICD-10-CM | POA: Diagnosis not present

## 2020-10-12 DIAGNOSIS — E46 Unspecified protein-calorie malnutrition: Secondary | ICD-10-CM | POA: Diagnosis not present

## 2020-10-12 DIAGNOSIS — Z1152 Encounter for screening for COVID-19: Secondary | ICD-10-CM | POA: Diagnosis not present

## 2020-10-19 ENCOUNTER — Telehealth: Payer: Self-pay | Admitting: *Deleted

## 2020-10-19 NOTE — Telephone Encounter (Signed)
CSW received call from pt's son requesting her meals on wheels be cancelled- per son, "she is not eating them and someone else could use it". CSW left voice message for [facility] MOW's program to cancel and to call pt's son to confirm.   [name], MSW, LCSW Clinical Social Worker  [facility] [employer] [phone]

## 2020-11-04 DIAGNOSIS — J9601 Acute respiratory failure with hypoxia: Secondary | ICD-10-CM | POA: Diagnosis not present

## 2020-11-04 DIAGNOSIS — Z20822 Contact with and (suspected) exposure to covid-19: Secondary | ICD-10-CM | POA: Diagnosis not present

## 2020-11-04 DIAGNOSIS — N281 Cyst of kidney, acquired: Secondary | ICD-10-CM | POA: Diagnosis not present

## 2020-11-04 DIAGNOSIS — R1084 Generalized abdominal pain: Secondary | ICD-10-CM | POA: Diagnosis not present

## 2020-11-04 DIAGNOSIS — A4189 Other specified sepsis: Secondary | ICD-10-CM | POA: Diagnosis not present

## 2020-11-04 DIAGNOSIS — Z7401 Bed confinement status: Secondary | ICD-10-CM | POA: Diagnosis not present

## 2020-11-04 DIAGNOSIS — K529 Noninfective gastroenteritis and colitis, unspecified: Secondary | ICD-10-CM | POA: Diagnosis not present

## 2020-11-04 DIAGNOSIS — A419 Sepsis, unspecified organism: Secondary | ICD-10-CM | POA: Diagnosis not present

## 2020-11-04 DIAGNOSIS — R627 Adult failure to thrive: Secondary | ICD-10-CM | POA: Diagnosis not present

## 2020-11-04 DIAGNOSIS — N3000 Acute cystitis without hematuria: Secondary | ICD-10-CM | POA: Diagnosis not present

## 2020-11-04 DIAGNOSIS — J181 Lobar pneumonia, unspecified organism: Secondary | ICD-10-CM | POA: Diagnosis not present

## 2020-11-04 DIAGNOSIS — K5730 Diverticulosis of large intestine without perforation or abscess without bleeding: Secondary | ICD-10-CM | POA: Diagnosis not present

## 2020-11-04 DIAGNOSIS — I517 Cardiomegaly: Secondary | ICD-10-CM | POA: Diagnosis not present

## 2020-11-04 DIAGNOSIS — R0902 Hypoxemia: Secondary | ICD-10-CM | POA: Diagnosis not present

## 2020-11-04 DIAGNOSIS — J189 Pneumonia, unspecified organism: Secondary | ICD-10-CM | POA: Diagnosis not present

## 2020-11-04 DIAGNOSIS — R531 Weakness: Secondary | ICD-10-CM | POA: Diagnosis not present

## 2020-11-04 DIAGNOSIS — E8809 Other disorders of plasma-protein metabolism, not elsewhere classified: Secondary | ICD-10-CM | POA: Diagnosis not present

## 2020-11-04 DIAGNOSIS — R600 Localized edema: Secondary | ICD-10-CM | POA: Diagnosis not present

## 2020-11-04 DIAGNOSIS — I959 Hypotension, unspecified: Secondary | ICD-10-CM | POA: Diagnosis not present

## 2020-11-04 DIAGNOSIS — R918 Other nonspecific abnormal finding of lung field: Secondary | ICD-10-CM | POA: Diagnosis not present

## 2020-11-04 DIAGNOSIS — K449 Diaphragmatic hernia without obstruction or gangrene: Secondary | ICD-10-CM | POA: Diagnosis not present

## 2020-11-04 DIAGNOSIS — Z515 Encounter for palliative care: Secondary | ICD-10-CM | POA: Diagnosis not present

## 2020-11-04 DIAGNOSIS — E860 Dehydration: Secondary | ICD-10-CM | POA: Diagnosis not present

## 2020-11-04 DIAGNOSIS — I10 Essential (primary) hypertension: Secondary | ICD-10-CM | POA: Diagnosis not present

## 2020-11-16 ENCOUNTER — Other Ambulatory Visit: Payer: Self-pay

## 2020-11-16 NOTE — Patient Outreach (Addendum)
[facility]) Care Management  11/16/2020  ANDERSEN MCKIVER 1920/08/29 [id number]   Case Closure    RN CM received notification that patient expired on 12/06/2020 under hospice care.   Plan: RN CM will close case.   Antionette Fairy, RN,BSN,CCM [facility] Care Management Telephonic Care Management Coordinator Direct Phone: [phone] Toll Free: [phone] Fax: [phone]

## 2020-11-18 DEATH — deceased

## 2021-01-03 ENCOUNTER — Encounter: Payer: Medicare HMO | Admitting: Cardiovascular Disease
